# Patient Record
Sex: Male | Born: 1960 | Race: White | Hispanic: No | Marital: Married | Smoking: Former smoker
Health system: Southern US, Community
[De-identification: ages and names within clinical notes are randomized; demographics above are authoritative.]

## PROBLEM LIST (undated history)

## (undated) DIAGNOSIS — I219 Acute myocardial infarction, unspecified: Secondary | ICD-10-CM

## (undated) DIAGNOSIS — I509 Heart failure, unspecified: Secondary | ICD-10-CM

## (undated) DIAGNOSIS — K219 Gastro-esophageal reflux disease without esophagitis: Secondary | ICD-10-CM

## (undated) DIAGNOSIS — C801 Malignant (primary) neoplasm, unspecified: Secondary | ICD-10-CM

## (undated) DIAGNOSIS — I10 Essential (primary) hypertension: Secondary | ICD-10-CM

## (undated) DIAGNOSIS — E119 Type 2 diabetes mellitus without complications: Secondary | ICD-10-CM

## (undated) DIAGNOSIS — J45909 Unspecified asthma, uncomplicated: Secondary | ICD-10-CM

## (undated) HISTORY — PX: PACEMAKER IMPLANT: EP1218

## (undated) HISTORY — PX: TONSILLECTOMY: SUR1361

## (undated) HISTORY — PX: BACK SURGERY: SHX140

## (undated) HISTORY — PX: CARDIAC DEFIBRILLATOR PLACEMENT: SHX171

---

## 2000-10-18 ENCOUNTER — Emergency Department (HOSPITAL_COMMUNITY): Admission: EM | Admit: 2000-10-18 | Discharge: 2000-10-18 | Payer: Self-pay | Admitting: Emergency Medicine

## 2004-07-15 DIAGNOSIS — F32A Depression, unspecified: Secondary | ICD-10-CM

## 2004-07-15 HISTORY — DX: Depression, unspecified: F32.A

## 2014-05-20 HISTORY — PX: CORONARY ARTERY BYPASS GRAFT: SHX141

## 2019-02-24 HISTORY — PX: INSERT / REPLACE / REMOVE PACEMAKER: SUR710

## 2020-02-11 ENCOUNTER — Emergency Department (HOSPITAL_COMMUNITY): Payer: No Typology Code available for payment source

## 2020-02-11 ENCOUNTER — Other Ambulatory Visit: Payer: Self-pay

## 2020-02-11 ENCOUNTER — Encounter (HOSPITAL_COMMUNITY): Payer: Self-pay | Admitting: Emergency Medicine

## 2020-02-11 ENCOUNTER — Emergency Department (HOSPITAL_COMMUNITY)
Admission: EM | Admit: 2020-02-11 | Discharge: 2020-02-12 | Disposition: A | Discharge status: Home / Self Care | Payer: No Typology Code available for payment source | Attending: Emergency Medicine | Admitting: Emergency Medicine

## 2020-02-11 DIAGNOSIS — I110 Hypertensive heart disease with heart failure: Secondary | ICD-10-CM | POA: Insufficient documentation

## 2020-02-11 DIAGNOSIS — E119 Type 2 diabetes mellitus without complications: Secondary | ICD-10-CM | POA: Insufficient documentation

## 2020-02-11 DIAGNOSIS — Y998 Other external cause status: Secondary | ICD-10-CM | POA: Diagnosis not present

## 2020-02-11 DIAGNOSIS — Z950 Presence of cardiac pacemaker: Secondary | ICD-10-CM | POA: Diagnosis not present

## 2020-02-11 DIAGNOSIS — S335XXA Sprain of ligaments of lumbar spine, initial encounter: Secondary | ICD-10-CM | POA: Diagnosis not present

## 2020-02-11 DIAGNOSIS — S199XXA Unspecified injury of neck, initial encounter: Secondary | ICD-10-CM | POA: Diagnosis present

## 2020-02-11 DIAGNOSIS — S134XXA Sprain of ligaments of cervical spine, initial encounter: Secondary | ICD-10-CM | POA: Insufficient documentation

## 2020-02-11 DIAGNOSIS — Y92410 Unspecified street and highway as the place of occurrence of the external cause: Secondary | ICD-10-CM | POA: Insufficient documentation

## 2020-02-11 DIAGNOSIS — I509 Heart failure, unspecified: Secondary | ICD-10-CM | POA: Diagnosis not present

## 2020-02-11 DIAGNOSIS — Y9389 Activity, other specified: Secondary | ICD-10-CM | POA: Insufficient documentation

## 2020-02-11 DIAGNOSIS — Z85828 Personal history of other malignant neoplasm of skin: Secondary | ICD-10-CM | POA: Insufficient documentation

## 2020-02-11 DIAGNOSIS — S139XXA Sprain of joints and ligaments of unspecified parts of neck, initial encounter: Secondary | ICD-10-CM

## 2020-02-11 HISTORY — DX: Acute myocardial infarction, unspecified: I21.9

## 2020-02-11 HISTORY — DX: Malignant (primary) neoplasm, unspecified: C80.1

## 2020-02-11 HISTORY — DX: Essential (primary) hypertension: I10

## 2020-02-11 HISTORY — DX: Type 2 diabetes mellitus without complications: E11.9

## 2020-02-11 HISTORY — DX: Heart failure, unspecified: I50.9

## 2020-02-11 MED ORDER — IBUPROFEN 400 MG PO TABS
400.0000 mg | ORAL_TABLET | Freq: Once | ORAL | Status: DC
  Filled 2020-02-11: qty 1
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
## 2020-02-12 ENCOUNTER — Emergency Department (HOSPITAL_COMMUNITY): Payer: No Typology Code available for payment source

## 2020-02-12 DIAGNOSIS — S134XXA Sprain of ligaments of cervical spine, initial encounter: Secondary | ICD-10-CM | POA: Diagnosis not present

## 2020-02-12 MED ORDER — TRAMADOL HCL 50 MG PO TABS: 50.0000 mg | ORAL_TABLET | Freq: Four times a day (QID) | ORAL | 0 refills | Status: DC | PRN

## 2020-02-12 NOTE — Discharge Instructions (Signed)
Apply ice as needed

## 2020-02-12 NOTE — ED Notes (Signed)
Pt back from CT

## 2020-03-20 ENCOUNTER — Other Ambulatory Visit (HOSPITAL_COMMUNITY): Payer: Self-pay | Admitting: Orthopedic Surgery

## 2020-03-20 DIAGNOSIS — G629 Polyneuropathy, unspecified: Secondary | ICD-10-CM

## 2020-03-24 ENCOUNTER — Telehealth (HOSPITAL_COMMUNITY): Payer: Self-pay

## 2020-03-30 ENCOUNTER — Ambulatory Visit (HOSPITAL_COMMUNITY): Admission: RE | Admit: 2020-03-30 | Payer: No Typology Code available for payment source | Source: Ambulatory Visit

## 2020-03-30 ENCOUNTER — Encounter (HOSPITAL_COMMUNITY): Payer: Self-pay

## 2020-04-11 ENCOUNTER — Ambulatory Visit (HOSPITAL_COMMUNITY)
Admission: RE | Admit: 2020-04-11 | Discharge: 2020-04-11 | Disposition: A | Discharge status: Home / Self Care | Payer: BC Managed Care – PPO | Source: Ambulatory Visit | Attending: Physician Assistant | Admitting: Physician Assistant

## 2020-04-11 ENCOUNTER — Ambulatory Visit (HOSPITAL_COMMUNITY)
Admission: RE | Admit: 2020-04-11 | Discharge: 2020-04-11 | Disposition: A | Discharge status: Home / Self Care | Payer: BC Managed Care – PPO | Source: Ambulatory Visit | Attending: Orthopedic Surgery | Admitting: Orthopedic Surgery

## 2020-04-11 ENCOUNTER — Other Ambulatory Visit: Payer: Self-pay

## 2020-04-11 DIAGNOSIS — Z950 Presence of cardiac pacemaker: Secondary | ICD-10-CM

## 2020-04-11 DIAGNOSIS — G629 Polyneuropathy, unspecified: Secondary | ICD-10-CM | POA: Diagnosis not present

## 2020-04-11 NOTE — Progress Notes (Signed)
Per order, Programmed to  DOO at 90 bpm  Tachy-therapies to off/disabled  Will program device back to pre-MRI settings after completion of exam and send transmission.

## 2020-05-20 DIAGNOSIS — Z95810 Presence of automatic (implantable) cardiac defibrillator: Secondary | ICD-10-CM

## 2020-05-20 DIAGNOSIS — Z950 Presence of cardiac pacemaker: Secondary | ICD-10-CM

## 2020-05-20 HISTORY — DX: Presence of automatic (implantable) cardiac defibrillator: Z95.810

## 2020-05-20 HISTORY — PX: CARDIAC CATHETERIZATION: SHX172

## 2020-05-20 HISTORY — DX: Presence of cardiac pacemaker: Z95.0

## 2020-09-08 ENCOUNTER — Other Ambulatory Visit: Payer: Self-pay | Admitting: Neurosurgery

## 2020-09-27 DIAGNOSIS — M5416 Radiculopathy, lumbar region: Secondary | ICD-10-CM

## 2020-10-06 NOTE — Progress Notes (Signed)
Surgical Instructions    Your procedure is scheduled on 10/11/20.  Report to [facility] Main Entrance "A" at 5:30 A.M., then check in with the Admitting office.  Call this number if you have problems the morning of surgery:  [phone]   If you have any questions prior to your surgery date call [phone]: Open Monday-Friday 8am-4pm    Remember:  Do not eat or drink after midnight the night before your surgery    Take these medicines the morning of surgery with A SIP OF WATER: FLUoxetine (PROZAC)  gabapentin (NEURONTIN)  isosorbide mononitrate (IMDUR)  metoprolol (TOPROL-XL) pravastatin (PRAVACHOL)  If Needed: Albuterol Sulfate (PROAIR RESPICLICK)   As of today, STOP taking any  Aleve, Naproxen, Ibuprofen, Motrin, Advil, Goody's, BC's, all herbal medications, fish oil, and all vitamins.  Follow your surgeon's instructions on when to stop Aspirin and Plavix.  If no instructions were given by your surgeon then you will need to call the office to get those instructions.     WHAT DO I DO ABOUT MY DIABETES MEDICATION?   Marland Kitchen Do not take Glipizide night before surgery or day of surgery.   Do not take Jardiance day before surgery or day of surgery.   . THE NIGHT BEFORE SURGERY, take _____35______ units of ___Lantus________insulin.       . THE MORNING OF SURGERY, do not take any insuline.  . The day of surgery, do not take other diabetes injectables, including Byetta (exenatide), Bydureon (exenatide ER), Victoza (liraglutide), or Trulicity (dulaglutide).  . If your CBG is greater than 220 mg/dL, you may take  of your sliding scale (correction) dose of insulin.   HOW TO MANAGE YOUR DIABETES BEFORE AND AFTER SURGERY  Why is it important to control my blood sugar before and after surgery? . Improving blood sugar levels before and after surgery helps healing and can limit problems. . A way of improving blood sugar control is eating a healthy diet by: o  Eating less sugar and  carbohydrates o  Increasing activity/exercise o  Talking with your doctor about reaching your blood sugar goals . High blood sugars (greater than 180 mg/dL) can raise your risk of infections and slow your recovery, so you will need to focus on controlling your diabetes during the weeks before surgery. . Make sure that the doctor who takes care of your diabetes knows about your planned surgery including the date and location.  How do I manage my blood sugar before surgery? . Check your blood sugar at least 4 times a day, starting 2 days before surgery, to make sure that the level is not too high or low.  . Check your blood sugar the morning of your surgery when you wake up and every 2 hours until you get to the Short Stay unit.  o If your blood sugar is less than 70 mg/dL, you will need to treat for low blood sugar: - Do not take insulin. - Treat a low blood sugar (less than 70 mg/dL) with  cup of clear juice (cranberry or apple), 4 glucose tablets, OR glucose gel. - Recheck blood sugar in 15 minutes after treatment (to make sure it is greater than 70 mg/dL). If your blood sugar is not greater than 70 mg/dL on recheck, call [phone] for further instructions. . Report your blood sugar to the short stay nurse when you get to Short Stay.  . If you are admitted to the hospital after surgery: o Your blood sugar will be checked by  the staff and you will probably be given insulin after surgery (instead of oral diabetes medicines) to make sure you have good blood sugar levels. o The goal for blood sugar control after surgery is 80-180 mg/dL.                      Do not wear jewelry            Do not wear lotions, powders, colognes, or deodorant.            Do not shave 48 hours prior to surgery.  Men may shave face and neck.            Do not bring valuables to the hospital.            [facility] is not responsible for any belongings or valuables.  Do NOT Smoke (Tobacco/Vaping) or drink  Alcohol 24 hours prior to your procedure If you use a CPAP at night, you may bring all equipment for your overnight stay.   Contacts, glasses, dentures or bridgework may not be worn into surgery, please bring cases for these belongings   For patients admitted to the hospital, discharge time will be determined by your treatment team.   Patients discharged the day of surgery will not be allowed to drive home, and someone needs to stay with them for 24 hours.    Special instructions:    Oral Hygiene is also important to reduce your risk of infection.  Remember - BRUSH YOUR TEETH THE MORNING OF SURGERY WITH YOUR REGULAR TOOTHPASTE   [place]- Preparing For Surgery  Before surgery, you can play an important role. Because skin is not sterile, your skin needs to be as free of germs as possible. You can reduce the number of germs on your skin by washing with CHG (chlorahexidine gluconate) Soap before surgery.  CHG is an antiseptic cleaner which kills germs and bonds with the skin to continue killing germs even after washing.     Please do not use if you have an allergy to CHG or antibacterial soaps. If your skin becomes reddened/irritated stop using the CHG.  Do not shave (including legs and underarms) for at least 48 hours prior to first CHG shower. It is OK to shave your face.  Please follow these instructions carefully.    1.  Shower the NIGHT BEFORE SURGERY and the MORNING OF SURGERY with CHG Soap.   If you chose to wash your hair, wash your hair first as usual with your normal shampoo. After you shampoo, rinse your hair and body thoroughly to remove the shampoo.  Then [employer] and genitals (private parts) with your normal soap and rinse thoroughly to remove soap.  2. After that Use CHG Soap as you would any other liquid soap. You can apply CHG directly to the skin and wash gently with a scrungie or a clean washcloth.   3. Apply the CHG Soap to your body ONLY FROM THE NECK DOWN.  Do  not use on open wounds or open sores. Avoid contact with your eyes, ears, mouth and genitals (private parts). Wash Face and genitals (private parts)  with your normal soap.   4. Wash thoroughly, paying special attention to the area where your surgery will be performed.  5. Thoroughly rinse your body with warm water from the neck down.  6. DO NOT shower/wash with your normal soap after using and rinsing off the CHG Soap.  7. [place] yourself  dry with a CLEAN TOWEL.  8. Wear CLEAN PAJAMAS to bed the night before surgery  9. Place CLEAN SHEETS on your bed the night before your surgery  10. DO NOT SLEEP WITH PETS.   Day of Surgery:  Take a shower with CHG soap. Wear Clean/Comfortable clothing the morning of surgery Do not apply any deodorants/lotions.   Remember to brush your teeth WITH YOUR REGULAR TOOTHPASTE.   Please read over the following fact sheets that you were given.

## 2020-10-09 ENCOUNTER — Encounter (HOSPITAL_COMMUNITY): Payer: Self-pay

## 2020-10-09 ENCOUNTER — Encounter (HOSPITAL_COMMUNITY)
Admission: RE | Admit: 2020-10-09 | Discharge: 2020-10-09 | Disposition: A | Discharge status: Home / Self Care | Payer: BC Managed Care – PPO | Source: Ambulatory Visit | Attending: Neurosurgery | Admitting: Neurosurgery

## 2020-10-09 ENCOUNTER — Other Ambulatory Visit: Payer: Self-pay

## 2020-10-09 DIAGNOSIS — Z01812 Encounter for preprocedural laboratory examination: Secondary | ICD-10-CM | POA: Insufficient documentation

## 2020-10-09 DIAGNOSIS — Z20822 Contact with and (suspected) exposure to covid-19: Secondary | ICD-10-CM | POA: Insufficient documentation

## 2020-10-09 HISTORY — DX: Unspecified asthma, uncomplicated: J45.909

## 2020-10-09 HISTORY — DX: Gastro-esophageal reflux disease without esophagitis: K21.9

## 2020-10-09 LAB — BASIC METABOLIC PANEL
Anion gap: 9 (ref 5–15)
BUN: 23 mg/dL — ABNORMAL HIGH (ref 6–20)
CO2: 27 mmol/L (ref 22–32)
Calcium: 10 mg/dL (ref 8.9–10.3)
Chloride: 100 mmol/L (ref 98–111)
Creatinine, Ser: 1.1 mg/dL (ref 0.61–1.24)
GFR, Estimated: >60 mL/min (ref >60)
Glucose, Bld: 202 mg/dL — ABNORMAL HIGH (ref 70–99)
Potassium: 5.8 mmol/L — ABNORMAL HIGH (ref 3.5–5.1)
Sodium: 136 mmol/L (ref 135–145)

## 2020-10-09 LAB — SURGICAL PCR SCREEN
MRSA, PCR: NEGATIVE
Staphylococcus aureus: NEGATIVE

## 2020-10-09 LAB — TYPE AND SCREEN
ABO/RH(D): AB POS
Antibody Screen: NEGATIVE

## 2020-10-09 LAB — CBC
HCT: 50.9 % (ref 39.0–52.0)
Hemoglobin: 17.1 g/dL — ABNORMAL HIGH (ref 13.0–17.0)
MCH: 30.5 pg (ref 26.0–34.0)
MCHC: 33.6 g/dL (ref 30.0–36.0)
MCV: 90.7 fL (ref 80.0–100.0)
Platelets: 226 10*3/uL (ref 150–400)
RBC: 5.61 MIL/uL (ref 4.22–5.81)
RDW: 13.1 % (ref 11.5–15.5)
WBC: 7.9 10*3/uL (ref 4.0–10.5)
nRBC: 0 % (ref 0.0–0.2)

## 2020-10-09 LAB — GLUCOSE, CAPILLARY: Glucose-Capillary: 141 mg/dL — ABNORMAL HIGH (ref 70–99)

## 2020-10-09 LAB — SARS CORONAVIRUS 2 (TAT 6-24 HRS): SARS Coronavirus 2: NEGATIVE

## 2020-10-09 NOTE — Progress Notes (Signed)
PCP - Jillyn Hidden, PA Cardiologist - Dr. Vella Redhead in [facility] with [place] implantable defibrillator  EP Dr. Coral Ceo Device Orders -  Rep Notified -   Chest x-ray - 05/25/20 EKG - 02/14/20 Stress Test - In [facility] 07/10/2020 ECHO - 10/29/18 Cardiac Cath - 04/15/17 and again at [facility] July 24, 2020  Sleep Study - Denies  DM Type  Fasting Blood Sugar - 110's CBG at PAT appt 141 Checks Blood Sugar _3-4____ times a day  Blood Thinner Instructions: Stop Plavix 5 days prior to surgery Aspirin Instructions: Stop 5 days prior to surgery  COVID TEST-   Anesthesia review: Yes cardiac history  Patient denies shortness of breath, fever, cough and chest pain at PAT appointment   All instructions explained to the patient, with a verbal understanding of the material. Patient agrees to go over the instructions while at home for a better understanding.The opportunity to ask questions was provided.

## 2020-10-10 NOTE — Progress Notes (Signed)
Anesthesia Chart Review:   Case: [id number] Date/Time: 10/11/20 0715   Procedures:      TLIF - L3-L4 - L4-L5;Possible Right L5-S1 microdiscectomy (N/A )     LUMBAR LAMINECTOMY/DECOMPRESSION MICRODISCECTOMY 1 LEVEL (Right )   Anesthesia type: General   Pre-op diagnosis: Stenosis   Location: [facility] OR ROOM 20 / [place] [facility]   Surgeons: Vallarie Mare, MD      DISCUSSION: Pt is 60 years old with hx CAD (s/p CABG 2016; s/p overlapping DES to CX 2019; stent to distal LM ostial CX and proximal CX 2020), CHF, ischemic cardiomyopathy (EF 55-60% on 2021 echo (was 35% in 2020)), AICD ([employer] Jude BiV ICD implanted 02/24/2019), HTN, DM, asthma  - Glucose 202 at pre-admission testing; HbA1c 9.0 on 09/28/20 (care everywhere). I notified Nikki in Dr. Marcello Moores' office.   - K 5.8. I notified Nikki in Dr. Marcello Moores' office. She will have pt reach out to cardiologist for guidance. Will recheck K day of surgery.   Pt to stop plavix 5 days before surgery  Perioperative prescription for ICD form is pending from Dr. Rhina Brackett   VS: BP 133/82   Pulse 82   Temp 37 C (Oral)   Resp 18   Ht 5\' 11"  (1.803 m)   Wt 120.8 kg   SpO2 98%   BMI 37.15 kg/m    PROVIDERS: - PCP is Jillyn Hidden, PA-C - Cardiologist is Vella Redhead ([facility] in [place]). Pt cleared for surgery at acceptable risk at last office visit 08/29/20 (see notes on paper chart) - EP cardiologist is Coral Ceo, MD   LABS:    - HbA1c 9.0 on 09/28/20 (care everywhere); glucose 202 at pre-admission testing - K 5.8  (all labs ordered are listed, but only abnormal results are displayed)  Labs Reviewed  GLUCOSE, CAPILLARY - Abnormal; Notable for the following components:      Result Value   Glucose-Capillary 141 (*)    All other components within normal limits  CBC - Abnormal; Notable for the following components:   Hemoglobin 17.1 (*)    All other components within normal limits  BASIC METABOLIC PANEL - Abnormal; Notable for the  following components:   Potassium 5.8 (*)    Glucose, Bld 202 (*)    BUN 23 (*)    All other components within normal limits  SARS CORONAVIRUS 2 (TAT 6-24 HRS)  SURGICAL PCR SCREEN  TYPE AND SCREEN     IMAGES: CXR 05/25/20 (care everywhere):  - Normal heart size. CABG. Pacemaker. The lungs and pleural spaces are clear. Thoracic spondylosis. - No acute abnormality   EKG 02/11/20: Atrial-sensed ventricular-paced rhythm. Biventricular pacemaker detected   CV: Cardiac cath 07/24/20 ([place]) (done for high risk stress test, chest pain sx) - widely patent LM and LCX stents - widely patent LIMA graft and OM graft - RCA is small, nondominant and occluded as on previous cardiac catheterizations - No change when compared to prior cardiac cath 2020  Echo 09/05/19 (the following is from office visit note 08/29/20 by Dr. Vella Redhead- see paper chart): - LV systolic function normal. EF 55-60%. Study inadequate for evaluation of regional wall motion. Doppler parameters were consistent with abnormal LV relaxation   Past Medical History:  Diagnosis Date  . AICD (automatic cardioverter/defibrillator) present 2022  . Asthma   . Cancer ([place])    "skin cancer"  . CHF (congestive heart failure) ([place])   . Depression 07/15/2004  . Diabetes mellitus without complication (  HCC)   . GERD (gastroesophageal reflux disease)   . Hypertension   . MI (myocardial infarction) ([place])   . Presence of permanent cardiac pacemaker 2022    Past Surgical History:  Procedure Laterality Date  . [place], [place]  2022  . CARDIAC DEFIBRILLATOR PLACEMENT    . CARDIAC DEFIBRILLATOR PLACEMENT    . CORONARY ARTERY BYPASS GRAFT  2016  . INSERT / REPLACE / REMOVE PACEMAKER  02/24/2019  . PACEMAKER IMPLANT    . TONSILLECTOMY      MEDICATIONS: . Albuterol Sulfate (PROAIR RESPICLICK) 142 (90 Base) MCG/ACT AEPB  . BAYER LOW DOSE 81 MG chewable tablet  .  Cholecalciferol 50 MCG (2000 UT) CAPS  . clopidogrel (PLAVIX) 75 MG tablet  . ENTRESTO 97-103 MG  . FLUoxetine (PROZAC) 40 MG capsule  . furosemide (LASIX) 40 MG tablet  . gabapentin (NEURONTIN) 300 MG capsule  . glipiZIDE (GLUCOTROL) 10 MG tablet  . isosorbide mononitrate (IMDUR) 60 MG 24 hr tablet  . JARDIANCE 25 MG TABS tablet  . LANTUS SOLOSTAR 100 UNIT/ML Solostar Pen  . metFORMIN (GLUCOPHAGE) 1000 MG tablet  . metoprolol (TOPROL-XL) 200 MG 24 hr tablet  . nitroGLYCERIN (NITROSTAT) 0.4 MG SL tablet  . NOVOLOG FLEXPEN 100 UNIT/ML FlexPen  . OVER THE COUNTER MEDICATION  . Potassium 99 MG TABS  . pravastatin (PRAVACHOL) 80 MG tablet  . spironolactone (ALDACTONE) 25 MG tablet  . traMADol (ULTRAM) 50 MG tablet   No current facility-administered medications for this encounter.   - Pt to stop plavix 5 days before surgery   If glucose and K are acceptable day of surgery, I anticipate pt can proceed with surgery as scheduled.  Willeen Cass, PhD, FNP-BC [facility]/Anesthesiology Phone: [phone] 10/10/2020 12:38 PM

## 2020-10-10 NOTE — Anesthesia Preprocedure Evaluation (Addendum)
Anesthesia Evaluation  Patient identified by MRN, date of birth, ID band Patient awake    Reviewed: Allergy & Precautions, NPO status , Patient's Chart, lab work & pertinent test results, reviewed documented beta blocker date and time   History of Anesthesia Complications Negative for: history of anesthetic complications  Airway Mallampati: III  TM Distance: >3 FB Neck ROM: Full    Dental  (+) Dental Advisory Given, Partial Upper   Pulmonary asthma , former smoker,    Pulmonary exam normal        Cardiovascular hypertension, Pt. on home beta blockers and Pt. on medications + Past MI, + Cardiac Stents, + CABG and +CHF  Normal cardiovascular exam+ pacemaker + Cardiac Defibrillator    Cardiac cath 07/24/20 ([place]) (done for high risk stress test, chest pain sx) - widely patent LM and LCX stents - widely patent LIMA graft and OM graft - RCA is small, nondominant and occluded as on previous cardiac catheterizations - No change when compared to prior cardiac cath 2020  Echo 09/05/19 (the following is from office visit note 08/29/20 by Dr. Vella Redhead- see paper chart): - LV systolic function normal. EF 55-60%. Study inadequate for evaluation of regional wall motion. Doppler parameters were consistent with abnormal LV relaxation  EKG 02/11/20: Atrial-sensed ventricular-paced rhythm. Biventricular pacemaker detected    Neuro/Psych PSYCHIATRIC DISORDERS Depression negative neurological ROS     GI/Hepatic Neg liver ROS, GERD  Controlled and Medicated,  Endo/Other  diabetes, Type 2, Oral Hypoglycemic Agents, Insulin Dependent Obesity   Renal/GU negative Renal ROS     Musculoskeletal negative musculoskeletal ROS (+)   Abdominal   Peds  Hematology  On plavix    Anesthesia Other Findings Covid test negative   Reproductive/Obstetrics                          Anesthesia  Physical Anesthesia Plan  ASA: III  Anesthesia Plan: General   Post-op Pain Management:    Induction: Intravenous  PONV Risk Score and Plan: 3 and Treatment may vary due to age or medical condition, Ondansetron, Dexamethasone, Midazolam and Scopolamine patch - Pre-op  Airway Management Planned: Oral ETT  Additional Equipment: None  Intra-op Plan:   Post-operative Plan: Extubation in OR  Informed Consent: I have reviewed the patients History and Physical, chart, labs and discussed the procedure including the risks, benefits and alternatives for the proposed anesthesia with the patient or authorized representative who has indicated his/her understanding and acceptance.     Dental advisory given  Plan Discussed with: CRNA and Anesthesiologist  Anesthesia Plan Comments:      Anesthesia Quick Evaluation

## 2020-10-11 ENCOUNTER — Inpatient Hospital Stay (HOSPITAL_COMMUNITY)
Admission: RE | Admit: 2020-10-11 | Discharge: 2020-10-14 | DRG: 454 | Disposition: A | Discharge status: Home Health | Payer: BC Managed Care – PPO | Attending: Neurosurgery | Admitting: Neurosurgery

## 2020-10-11 ENCOUNTER — Inpatient Hospital Stay (HOSPITAL_COMMUNITY): Payer: BC Managed Care – PPO

## 2020-10-11 ENCOUNTER — Encounter (HOSPITAL_COMMUNITY)
Admission: RE | Disposition: A | Discharge status: Home Health | Payer: Self-pay | Source: Home / Self Care | Attending: Neurosurgery

## 2020-10-11 ENCOUNTER — Inpatient Hospital Stay (HOSPITAL_COMMUNITY): Payer: BC Managed Care – PPO | Admitting: Physician Assistant

## 2020-10-11 ENCOUNTER — Inpatient Hospital Stay (HOSPITAL_COMMUNITY): Payer: BC Managed Care – PPO | Admitting: Vascular Surgery

## 2020-10-11 ENCOUNTER — Encounter (HOSPITAL_COMMUNITY): Payer: Self-pay

## 2020-10-11 ENCOUNTER — Other Ambulatory Visit: Payer: Self-pay

## 2020-10-11 DIAGNOSIS — I2510 Atherosclerotic heart disease of native coronary artery without angina pectoris: Secondary | ICD-10-CM | POA: Diagnosis present

## 2020-10-11 DIAGNOSIS — G9741 Accidental puncture or laceration of dura during a procedure: Secondary | ICD-10-CM | POA: Diagnosis not present

## 2020-10-11 DIAGNOSIS — M5416 Radiculopathy, lumbar region: Secondary | ICD-10-CM | POA: Diagnosis present

## 2020-10-11 DIAGNOSIS — J45909 Unspecified asthma, uncomplicated: Secondary | ICD-10-CM | POA: Diagnosis present

## 2020-10-11 DIAGNOSIS — Z6838 Body mass index (BMI) 38.0-38.9, adult: Secondary | ICD-10-CM | POA: Diagnosis not present

## 2020-10-11 DIAGNOSIS — Z87891 Personal history of nicotine dependence: Secondary | ICD-10-CM | POA: Diagnosis not present

## 2020-10-11 DIAGNOSIS — Z951 Presence of aortocoronary bypass graft: Secondary | ICD-10-CM

## 2020-10-11 DIAGNOSIS — E669 Obesity, unspecified: Secondary | ICD-10-CM | POA: Diagnosis present

## 2020-10-11 DIAGNOSIS — Z888 Allergy status to other drugs, medicaments and biological substances status: Secondary | ICD-10-CM | POA: Diagnosis not present

## 2020-10-11 DIAGNOSIS — E119 Type 2 diabetes mellitus without complications: Secondary | ICD-10-CM | POA: Diagnosis present

## 2020-10-11 DIAGNOSIS — Y838 Other surgical procedures as the cause of abnormal reaction of the patient, or of later complication, without mention of misadventure at the time of the procedure: Secondary | ICD-10-CM | POA: Diagnosis not present

## 2020-10-11 DIAGNOSIS — Z794 Long term (current) use of insulin: Secondary | ICD-10-CM | POA: Diagnosis not present

## 2020-10-11 DIAGNOSIS — Z85828 Personal history of other malignant neoplasm of skin: Secondary | ICD-10-CM | POA: Diagnosis not present

## 2020-10-11 DIAGNOSIS — Z7982 Long term (current) use of aspirin: Secondary | ICD-10-CM

## 2020-10-11 DIAGNOSIS — I252 Old myocardial infarction: Secondary | ICD-10-CM | POA: Diagnosis not present

## 2020-10-11 DIAGNOSIS — F32A Depression, unspecified: Secondary | ICD-10-CM | POA: Diagnosis present

## 2020-10-11 DIAGNOSIS — I10 Essential (primary) hypertension: Secondary | ICD-10-CM | POA: Diagnosis present

## 2020-10-11 DIAGNOSIS — E11649 Type 2 diabetes mellitus with hypoglycemia without coma: Secondary | ICD-10-CM | POA: Diagnosis not present

## 2020-10-11 DIAGNOSIS — K219 Gastro-esophageal reflux disease without esophagitis: Secondary | ICD-10-CM | POA: Diagnosis present

## 2020-10-11 DIAGNOSIS — Z79899 Other long term (current) drug therapy: Secondary | ICD-10-CM | POA: Diagnosis not present

## 2020-10-11 DIAGNOSIS — Z95810 Presence of automatic (implantable) cardiac defibrillator: Secondary | ICD-10-CM | POA: Diagnosis not present

## 2020-10-11 DIAGNOSIS — I255 Ischemic cardiomyopathy: Secondary | ICD-10-CM | POA: Diagnosis present

## 2020-10-11 DIAGNOSIS — M48061 Spinal stenosis, lumbar region without neurogenic claudication: Secondary | ICD-10-CM | POA: Diagnosis present

## 2020-10-11 DIAGNOSIS — Z20822 Contact with and (suspected) exposure to covid-19: Secondary | ICD-10-CM | POA: Diagnosis present

## 2020-10-11 DIAGNOSIS — Z419 Encounter for procedure for purposes other than remedying health state, unspecified: Secondary | ICD-10-CM

## 2020-10-11 HISTORY — PX: TRANSFORAMINAL LUMBAR INTERBODY FUSION (TLIF) WITH PEDICLE SCREW FIXATION 2 LEVEL: SHX6142

## 2020-10-11 LAB — POCT I-STAT, CHEM 8
BUN: 22 mg/dL — ABNORMAL HIGH (ref 6–20)
Calcium, Ion: 1.15 mmol/L (ref 1.15–1.40)
Chloride: 103 mmol/L (ref 98–111)
Creatinine, Ser: 1 mg/dL (ref 0.61–1.24)
Glucose, Bld: 102 mg/dL — ABNORMAL HIGH (ref 70–99)
HCT: 46 % (ref 39.0–52.0)
Hemoglobin: 15.6 g/dL (ref 13.0–17.0)
Potassium: 4.2 mmol/L (ref 3.5–5.1)
Sodium: 138 mmol/L (ref 135–145)
TCO2: 27 mmol/L (ref 22–32)

## 2020-10-11 LAB — GLUCOSE, CAPILLARY
Glucose-Capillary: 113 mg/dL — ABNORMAL HIGH (ref 70–99)
Glucose-Capillary: 244 mg/dL — ABNORMAL HIGH (ref 70–99)

## 2020-10-11 LAB — ABO/RH: ABO/RH(D): AB POS

## 2020-10-11 SURGERY — TRANSFORAMINAL LUMBAR INTERBODY FUSION (TLIF) WITH PEDICLE SCREW FIXATION 2 LEVEL
Anesthesia: General | Site: Back | Laterality: Right

## 2020-10-11 MED ORDER — BUPIVACAINE HCL (PF) 0.5 % IJ SOLN
INTRAMUSCULAR | Status: DC | PRN
  Administered 2020-10-11: 30 mL

## 2020-10-11 MED ORDER — SPIRONOLACTONE 25 MG PO TABS
25.0000 mg | ORAL_TABLET | Freq: Every day | ORAL | Status: DC
  Administered 2020-10-12 – 2020-10-14 (×3): 25 mg via ORAL
  Filled 2020-10-11 (×3): qty 1

## 2020-10-11 MED ORDER — ONDANSETRON HCL 4 MG/2ML IJ SOLN
INTRAMUSCULAR | Status: DC | PRN
  Administered 2020-10-11: 4 mg via INTRAVENOUS

## 2020-10-11 MED ORDER — OXYCODONE HCL 5 MG PO TABS
ORAL_TABLET | ORAL | Status: AC
  Filled 2020-10-11: qty 1

## 2020-10-11 MED ORDER — EPHEDRINE 5 MG/ML INJ
INTRAVENOUS | Status: AC
  Filled 2020-10-11: qty 10

## 2020-10-11 MED ORDER — ALBUMIN HUMAN 5 % IV SOLN: INTRAVENOUS | Status: DC | PRN

## 2020-10-11 MED ORDER — INSULIN GLARGINE 100 UNIT/ML ~~LOC~~ SOLN
72.0000 [IU] | Freq: Every day | SUBCUTANEOUS | Status: DC
  Administered 2020-10-11 – 2020-10-13 (×3): 72 [IU] via SUBCUTANEOUS
  Filled 2020-10-11 (×5): qty 0.72

## 2020-10-11 MED ORDER — SUGAMMADEX SODIUM 200 MG/2ML IV SOLN
INTRAVENOUS | Status: DC | PRN
  Administered 2020-10-11: 100 mg via INTRAVENOUS
  Administered 2020-10-11: 50 mg via INTRAVENOUS
  Administered 2020-10-11: 100 mg via INTRAVENOUS

## 2020-10-11 MED ORDER — OXYCODONE HCL 5 MG PO TABS
5.0000 mg | ORAL_TABLET | Freq: Once | ORAL | Status: AC | PRN
  Administered 2020-10-11: 5 mg via ORAL

## 2020-10-11 MED ORDER — INSULIN ASPART 100 UNIT/ML IJ SOLN
INTRAMUSCULAR | Status: AC
  Filled 2020-10-11: qty 1

## 2020-10-11 MED ORDER — ONDANSETRON HCL 4 MG PO TABS: 4.0000 mg | ORAL_TABLET | Freq: Four times a day (QID) | ORAL | Status: DC | PRN

## 2020-10-11 MED ORDER — CYCLOBENZAPRINE HCL 10 MG PO TABS
10.0000 mg | ORAL_TABLET | Freq: Three times a day (TID) | ORAL | Status: DC | PRN
  Administered 2020-10-12 – 2020-10-14 (×3): 10 mg via ORAL
  Filled 2020-10-11 (×3): qty 1

## 2020-10-11 MED ORDER — CEFAZOLIN SODIUM 1 G IJ SOLR
INTRAMUSCULAR | Status: AC
  Filled 2020-10-11: qty 10

## 2020-10-11 MED ORDER — LACTATED RINGERS IV SOLN: INTRAVENOUS | Status: DC | PRN

## 2020-10-11 MED ORDER — METFORMIN HCL 500 MG PO TABS
1000.0000 mg | ORAL_TABLET | Freq: Two times a day (BID) | ORAL | Status: DC
  Administered 2020-10-12 – 2020-10-14 (×5): 1000 mg via ORAL
  Filled 2020-10-11 (×5): qty 2

## 2020-10-11 MED ORDER — PROMETHAZINE HCL 25 MG/ML IJ SOLN: 6.2500 mg | INTRAMUSCULAR | Status: DC | PRN

## 2020-10-11 MED ORDER — INSULIN ASPART 100 UNIT/ML IJ SOLN
30.0000 [IU] | Freq: Three times a day (TID) | INTRAMUSCULAR | Status: DC
  Administered 2020-10-12 – 2020-10-14 (×7): 30 [IU] via SUBCUTANEOUS

## 2020-10-11 MED ORDER — LACTATED RINGERS IV SOLN: INTRAVENOUS | Status: DC

## 2020-10-11 MED ORDER — OXYCODONE-ACETAMINOPHEN 5-325 MG PO TABS
1.0000 | ORAL_TABLET | Freq: Four times a day (QID) | ORAL | Status: DC | PRN
  Administered 2020-10-11 – 2020-10-14 (×8): 2 via ORAL
  Filled 2020-10-11 (×8): qty 2

## 2020-10-11 MED ORDER — CHLORHEXIDINE GLUCONATE CLOTH 2 % EX PADS: 6.0000 | MEDICATED_PAD | Freq: Once | CUTANEOUS | Status: DC

## 2020-10-11 MED ORDER — ONDANSETRON HCL 4 MG/2ML IJ SOLN
INTRAMUSCULAR | Status: AC
  Filled 2020-10-11: qty 2

## 2020-10-11 MED ORDER — FENTANYL CITRATE (PF) 100 MCG/2ML IJ SOLN
INTRAMUSCULAR | Status: AC
  Filled 2020-10-11: qty 2

## 2020-10-11 MED ORDER — FLUOXETINE HCL 20 MG PO CAPS
40.0000 mg | ORAL_CAPSULE | Freq: Every day | ORAL | Status: DC
  Administered 2020-10-12 – 2020-10-14 (×3): 40 mg via ORAL
  Filled 2020-10-11 (×3): qty 2

## 2020-10-11 MED ORDER — 0.9 % SODIUM CHLORIDE (POUR BTL) OPTIME
TOPICAL | Status: DC | PRN
  Administered 2020-10-11: 1000 mL

## 2020-10-11 MED ORDER — DEXAMETHASONE SODIUM PHOSPHATE 10 MG/ML IJ SOLN
INTRAMUSCULAR | Status: DC | PRN
  Administered 2020-10-11: 10 mg via INTRAVENOUS

## 2020-10-11 MED ORDER — ENOXAPARIN SODIUM 40 MG/0.4ML IJ SOSY
40.0000 mg | PREFILLED_SYRINGE | INTRAMUSCULAR | Status: DC
  Administered 2020-10-12 – 2020-10-14 (×3): 40 mg via SUBCUTANEOUS
  Filled 2020-10-11 (×3): qty 0.4

## 2020-10-11 MED ORDER — PHENYLEPHRINE HCL-NACL 10-0.9 MG/250ML-% IV SOLN
INTRAVENOUS | Status: DC | PRN
  Administered 2020-10-11: 50 ug/min via INTRAVENOUS
  Administered 2020-10-11: 75 ug/min via INTRAVENOUS

## 2020-10-11 MED ORDER — SODIUM CHLORIDE 0.9 % IV SOLN
250.0000 mL | INTRAVENOUS | Status: DC
  Administered 2020-10-11: 250 mL via INTRAVENOUS

## 2020-10-11 MED ORDER — LIDOCAINE-EPINEPHRINE 1 %-1:100000 IJ SOLN
INTRAMUSCULAR | Status: DC | PRN
  Administered 2020-10-11: 10 mL

## 2020-10-11 MED ORDER — POTASSIUM CHLORIDE IN NACL 20-0.9 MEQ/L-% IV SOLN
INTRAVENOUS | Status: DC
  Filled 2020-10-11: qty 1000

## 2020-10-11 MED ORDER — SODIUM CHLORIDE 0.9% FLUSH: 3.0000 mL | INTRAVENOUS | Status: DC | PRN

## 2020-10-11 MED ORDER — EPHEDRINE SULFATE-NACL 50-0.9 MG/10ML-% IV SOSY
PREFILLED_SYRINGE | INTRAVENOUS | Status: DC | PRN
  Administered 2020-10-11: 10 mg via INTRAVENOUS
  Administered 2020-10-11 (×3): 5 mg via INTRAVENOUS
  Administered 2020-10-11: 10 mg via INTRAVENOUS
  Administered 2020-10-11: 5 mg via INTRAVENOUS

## 2020-10-11 MED ORDER — MIDAZOLAM HCL 2 MG/2ML IJ SOLN
INTRAMUSCULAR | Status: AC
  Filled 2020-10-11: qty 2

## 2020-10-11 MED ORDER — THROMBIN 5000 UNITS EX SOLR
CUTANEOUS | Status: AC
  Filled 2020-10-11: qty 5000

## 2020-10-11 MED ORDER — ACETAMINOPHEN 325 MG PO TABS: 650.0000 mg | ORAL_TABLET | ORAL | Status: DC | PRN

## 2020-10-11 MED ORDER — FUROSEMIDE 40 MG PO TABS
40.0000 mg | ORAL_TABLET | Freq: Two times a day (BID) | ORAL | Status: DC
  Administered 2020-10-12 – 2020-10-14 (×5): 40 mg via ORAL
  Filled 2020-10-11 (×5): qty 1

## 2020-10-11 MED ORDER — BUPIVACAINE HCL (PF) 0.5 % IJ SOLN
INTRAMUSCULAR | Status: AC
  Filled 2020-10-11: qty 30

## 2020-10-11 MED ORDER — PRAVASTATIN SODIUM 40 MG PO TABS
80.0000 mg | ORAL_TABLET | Freq: Every day | ORAL | Status: DC
  Administered 2020-10-12 – 2020-10-13 (×2): 80 mg via ORAL
  Filled 2020-10-11 (×3): qty 2

## 2020-10-11 MED ORDER — INSULIN GLARGINE 100 UNIT/ML SOLOSTAR PEN: 72.0000 [IU] | PEN_INJECTOR | Freq: Every day | SUBCUTANEOUS | Status: DC

## 2020-10-11 MED ORDER — INSULIN ASPART 100 UNIT/ML IJ SOLN
5.0000 [IU] | Freq: Once | INTRAMUSCULAR | Status: AC
  Administered 2020-10-11: 5 [IU] via SUBCUTANEOUS

## 2020-10-11 MED ORDER — PROPOFOL 10 MG/ML IV BOLUS
INTRAVENOUS | Status: DC | PRN
  Administered 2020-10-11: 160 mg via INTRAVENOUS

## 2020-10-11 MED ORDER — CEFAZOLIN SODIUM-DEXTROSE 1-4 GM/50ML-% IV SOLN
INTRAVENOUS | Status: DC | PRN
  Administered 2020-10-11: 1 g via INTRAVENOUS

## 2020-10-11 MED ORDER — INSULIN ASPART 100 UNIT/ML FLEXPEN: 30.0000 [IU] | PEN_INJECTOR | Freq: Three times a day (TID) | SUBCUTANEOUS | Status: DC

## 2020-10-11 MED ORDER — CEFAZOLIN SODIUM-DEXTROSE 2-4 GM/100ML-% IV SOLN
2.0000 g | Freq: Three times a day (TID) | INTRAVENOUS | Status: AC
  Administered 2020-10-11 – 2020-10-12 (×4): 2 g via INTRAVENOUS
  Filled 2020-10-11 (×5): qty 100

## 2020-10-11 MED ORDER — ACETAMINOPHEN 650 MG RE SUPP: 650.0000 mg | RECTAL | Status: DC | PRN

## 2020-10-11 MED ORDER — ALBUTEROL SULFATE 108 (90 BASE) MCG/ACT IN AEPB: 2.0000 | INHALATION_SPRAY | Freq: Three times a day (TID) | RESPIRATORY_TRACT | Status: DC | PRN

## 2020-10-11 MED ORDER — SODIUM CHLORIDE (PF) 0.9 % IJ SOLN
INTRAMUSCULAR | Status: DC | PRN
  Administered 2020-10-11: 10 mL

## 2020-10-11 MED ORDER — CEFAZOLIN SODIUM-DEXTROSE 2-4 GM/100ML-% IV SOLN
2.0000 g | INTRAVENOUS | Status: AC
  Administered 2020-10-11 (×2): 2 g via INTRAVENOUS
  Filled 2020-10-11: qty 100

## 2020-10-11 MED ORDER — PHENOL 1.4 % MT LIQD: 1.0000 | OROMUCOSAL | Status: DC | PRN

## 2020-10-11 MED ORDER — SACUBITRIL-VALSARTAN 97-103 MG PO TABS
1.0000 | ORAL_TABLET | Freq: Two times a day (BID) | ORAL | Status: DC
  Administered 2020-10-11 – 2020-10-14 (×6): 1 via ORAL
  Filled 2020-10-11 (×7): qty 1

## 2020-10-11 MED ORDER — FENTANYL CITRATE (PF) 100 MCG/2ML IJ SOLN
25.0000 ug | INTRAMUSCULAR | Status: DC | PRN
  Administered 2020-10-11 (×3): 50 ug via INTRAVENOUS

## 2020-10-11 MED ORDER — HEMOSTATIC AGENTS (NO CHARGE) OPTIME
TOPICAL | Status: DC | PRN
  Administered 2020-10-11: 1 via TOPICAL

## 2020-10-11 MED ORDER — POLYETHYLENE GLYCOL 3350 17 G PO PACK: 17.0000 g | PACK | Freq: Every day | ORAL | Status: DC | PRN

## 2020-10-11 MED ORDER — PHENYLEPHRINE 40 MCG/ML (10ML) SYRINGE FOR IV PUSH (FOR BLOOD PRESSURE SUPPORT)
PREFILLED_SYRINGE | INTRAVENOUS | Status: AC
  Filled 2020-10-11: qty 10

## 2020-10-11 MED ORDER — EMPAGLIFLOZIN 25 MG PO TABS
25.0000 mg | ORAL_TABLET | Freq: Every day | ORAL | Status: DC
  Administered 2020-10-12 – 2020-10-14 (×3): 25 mg via ORAL
  Filled 2020-10-11 (×3): qty 1

## 2020-10-11 MED ORDER — METOPROLOL SUCCINATE ER 50 MG PO TB24
200.0000 mg | ORAL_TABLET | Freq: Every day | ORAL | Status: DC
  Administered 2020-10-12 – 2020-10-14 (×3): 200 mg via ORAL
  Filled 2020-10-11 (×3): qty 4

## 2020-10-11 MED ORDER — GABAPENTIN 300 MG PO CAPS
600.0000 mg | ORAL_CAPSULE | Freq: Three times a day (TID) | ORAL | Status: DC
  Administered 2020-10-11 – 2020-10-14 (×8): 600 mg via ORAL
  Filled 2020-10-11 (×8): qty 2

## 2020-10-11 MED ORDER — DOCUSATE SODIUM 100 MG PO CAPS
100.0000 mg | ORAL_CAPSULE | Freq: Two times a day (BID) | ORAL | Status: DC
  Administered 2020-10-11 – 2020-10-14 (×5): 100 mg via ORAL
  Filled 2020-10-11 (×6): qty 1

## 2020-10-11 MED ORDER — BUPIVACAINE LIPOSOME 1.3 % IJ SUSP
INTRAMUSCULAR | Status: DC | PRN
  Administered 2020-10-11: 20 mL

## 2020-10-11 MED ORDER — MIDAZOLAM HCL 5 MG/5ML IJ SOLN
INTRAMUSCULAR | Status: DC | PRN
  Administered 2020-10-11: 2 mg via INTRAVENOUS

## 2020-10-11 MED ORDER — KETAMINE HCL 50 MG/5ML IJ SOSY
PREFILLED_SYRINGE | INTRAMUSCULAR | Status: AC
  Filled 2020-10-11: qty 5

## 2020-10-11 MED ORDER — CHLORHEXIDINE GLUCONATE 0.12 % MT SOLN
15.0000 mL | Freq: Once | OROMUCOSAL | Status: AC
  Administered 2020-10-11: 15 mL via OROMUCOSAL
  Filled 2020-10-11: qty 15

## 2020-10-11 MED ORDER — FENTANYL CITRATE (PF) 250 MCG/5ML IJ SOLN
INTRAMUSCULAR | Status: AC
  Filled 2020-10-11: qty 5

## 2020-10-11 MED ORDER — FENTANYL CITRATE (PF) 100 MCG/2ML IJ SOLN
INTRAMUSCULAR | Status: DC | PRN
  Administered 2020-10-11: 50 ug via INTRAVENOUS
  Administered 2020-10-11: 100 ug via INTRAVENOUS
  Administered 2020-10-11 (×2): 50 ug via INTRAVENOUS

## 2020-10-11 MED ORDER — NITROGLYCERIN 0.4 MG SL SUBL: 0.4000 mg | SUBLINGUAL_TABLET | SUBLINGUAL | Status: DC | PRN

## 2020-10-11 MED ORDER — MENTHOL 3 MG MT LOZG: 1.0000 | LOZENGE | OROMUCOSAL | Status: DC | PRN

## 2020-10-11 MED ORDER — ONDANSETRON HCL 4 MG/2ML IJ SOLN: 4.0000 mg | Freq: Four times a day (QID) | INTRAMUSCULAR | Status: DC | PRN

## 2020-10-11 MED ORDER — ROCURONIUM BROMIDE 10 MG/ML (PF) SYRINGE
PREFILLED_SYRINGE | INTRAVENOUS | Status: AC
  Filled 2020-10-11: qty 10

## 2020-10-11 MED ORDER — GLIPIZIDE 5 MG PO TABS
10.0000 mg | ORAL_TABLET | Freq: Two times a day (BID) | ORAL | Status: DC
  Administered 2020-10-12 – 2020-10-14 (×4): 10 mg via ORAL
  Filled 2020-10-11 (×4): qty 2

## 2020-10-11 MED ORDER — SODIUM CHLORIDE 0.9% FLUSH
3.0000 mL | Freq: Two times a day (BID) | INTRAVENOUS | Status: DC
  Administered 2020-10-11 – 2020-10-14 (×4): 3 mL via INTRAVENOUS

## 2020-10-11 MED ORDER — MORPHINE SULFATE (PF) 2 MG/ML IV SOLN
2.0000 mg | INTRAVENOUS | Status: DC | PRN
  Administered 2020-10-11 – 2020-10-12 (×2): 2 mg via INTRAVENOUS
  Filled 2020-10-11 (×2): qty 1

## 2020-10-11 MED ORDER — ROCURONIUM BROMIDE 10 MG/ML (PF) SYRINGE
PREFILLED_SYRINGE | INTRAVENOUS | Status: DC | PRN
  Administered 2020-10-11 (×2): 30 mg via INTRAVENOUS
  Administered 2020-10-11: 20 mg via INTRAVENOUS
  Administered 2020-10-11: 30 mg via INTRAVENOUS
  Administered 2020-10-11: 20 mg via INTRAVENOUS
  Administered 2020-10-11: 40 mg via INTRAVENOUS
  Administered 2020-10-11: 60 mg via INTRAVENOUS

## 2020-10-11 MED ORDER — DEXAMETHASONE SODIUM PHOSPHATE 10 MG/ML IJ SOLN
INTRAMUSCULAR | Status: AC
  Filled 2020-10-11: qty 1

## 2020-10-11 MED ORDER — LIDOCAINE 2% (20 MG/ML) 5 ML SYRINGE
INTRAMUSCULAR | Status: AC
  Filled 2020-10-11: qty 5

## 2020-10-11 MED ORDER — BUPIVACAINE LIPOSOME 1.3 % IJ SUSP
INTRAMUSCULAR | Status: AC
  Filled 2020-10-11: qty 20

## 2020-10-11 MED ORDER — CEFAZOLIN SODIUM-DEXTROSE 2-4 GM/100ML-% IV SOLN
2.0000 g | INTRAVENOUS | Status: DC
  Filled 2020-10-11: qty 100

## 2020-10-11 MED ORDER — KETAMINE HCL 10 MG/ML IJ SOLN
INTRAMUSCULAR | Status: DC | PRN
  Administered 2020-10-11: 20 mg via INTRAVENOUS
  Administered 2020-10-11 (×3): 10 mg via INTRAVENOUS

## 2020-10-11 MED ORDER — THROMBIN 5000 UNITS EX SOLR: OROMUCOSAL | Status: DC | PRN

## 2020-10-11 MED ORDER — POTASSIUM 99 MG PO TABS: 99.0000 mg | ORAL_TABLET | Freq: Every day | ORAL | Status: DC

## 2020-10-11 MED ORDER — ISOSORBIDE MONONITRATE ER 60 MG PO TB24
60.0000 mg | ORAL_TABLET | Freq: Every day | ORAL | Status: DC
  Administered 2020-10-12 – 2020-10-14 (×3): 60 mg via ORAL
  Filled 2020-10-11 (×3): qty 1

## 2020-10-11 MED ORDER — VITAMIN D 25 MCG (1000 UNIT) PO TABS
2000.0000 [IU] | ORAL_TABLET | Freq: Every day | ORAL | Status: DC
  Administered 2020-10-12 – 2020-10-14 (×3): 2000 [IU] via ORAL
  Filled 2020-10-11 (×3): qty 2

## 2020-10-11 MED ORDER — LIDOCAINE-EPINEPHRINE 1 %-1:100000 IJ SOLN
INTRAMUSCULAR | Status: AC
  Filled 2020-10-11: qty 1

## 2020-10-11 MED ORDER — PROPOFOL 10 MG/ML IV BOLUS
INTRAVENOUS | Status: AC
  Filled 2020-10-11: qty 40

## 2020-10-11 MED ORDER — OXYCODONE HCL 5 MG/5ML PO SOLN: 5.0000 mg | Freq: Once | ORAL | Status: AC | PRN

## 2020-10-11 MED ORDER — PHENYLEPHRINE 40 MCG/ML (10ML) SYRINGE FOR IV PUSH (FOR BLOOD PRESSURE SUPPORT)
PREFILLED_SYRINGE | INTRAVENOUS | Status: DC | PRN
  Administered 2020-10-11: 120 ug via INTRAVENOUS
  Administered 2020-10-11: 160 ug via INTRAVENOUS

## 2020-10-11 MED ORDER — ORAL CARE MOUTH RINSE: 15.0000 mL | Freq: Once | OROMUCOSAL | Status: AC

## 2020-10-11 MED ORDER — LIDOCAINE 2% (20 MG/ML) 5 ML SYRINGE
INTRAMUSCULAR | Status: DC | PRN
  Administered 2020-10-11: 40 mg via INTRAVENOUS

## 2020-10-11 MED ORDER — FLEET ENEMA 7-19 GM/118ML RE ENEM: 1.0000 | ENEMA | Freq: Once | RECTAL | Status: DC | PRN

## 2020-10-11 MED ORDER — ALBUTEROL SULFATE (2.5 MG/3ML) 0.083% IN NEBU: 2.5000 mg | INHALATION_SOLUTION | Freq: Three times a day (TID) | RESPIRATORY_TRACT | Status: DC | PRN

## 2020-10-11 SURGICAL SUPPLY — 92 items
BAND RUBBER #18 3X1/16 STRL (MISCELLANEOUS) IMPLANT
BASKET BONE COLLECTION (BASKET) ×3 IMPLANT
BENZOIN TINCTURE PRP APPL 2/3 (GAUZE/BANDAGES/DRESSINGS) IMPLANT
BLADE CLIPPER SURG (BLADE) ×3 IMPLANT
BUR CARBIDE MATCH 3.0 (BURR) ×3 IMPLANT
BUR MATCHSTICK NEURO 3.0 LAGG (BURR) IMPLANT
BUR PRECISION FLUTE 5.0 (BURR) IMPLANT
BUR PRECISION FLUTE 6.0 (BURR) ×3 IMPLANT
BUR ROUND FLUTED 5 RND (BURR) ×3 IMPLANT
CAGE SABLE 10X26 6-12 8D (Cage) ×3 IMPLANT
CAGE SABLE 10X30 6-12 8D (Cage) ×3 IMPLANT
CANISTER SUCT 3000ML PPV (MISCELLANEOUS) ×3 IMPLANT
CAP LOCKING THREADED (Cap) ×18 IMPLANT
CNTNR URN SCR LID CUP LEK RST (MISCELLANEOUS) ×2 IMPLANT
CONT SPEC 4OZ STRL OR WHT (MISCELLANEOUS) ×3
COVER BACK TABLE 60X90IN (DRAPES) ×3 IMPLANT
COVER WAND RF STERILE (DRAPES) IMPLANT
DECANTER SPIKE VIAL GLASS SM (MISCELLANEOUS) ×3 IMPLANT
DERMABOND ADVANCED (GAUZE/BANDAGES/DRESSINGS) ×1
DERMABOND ADVANCED .7 DNX12 (GAUZE/BANDAGES/DRESSINGS) ×2 IMPLANT
DRAIN JACKSON PRATT 10MM FLAT (MISCELLANEOUS) IMPLANT
DRAPE 3/4 80X56 (DRAPES) ×3 IMPLANT
DRAPE C-ARM 42X72 X-RAY (DRAPES) ×3 IMPLANT
DRAPE C-ARMOR (DRAPES) ×3 IMPLANT
DRAPE LAPAROTOMY 100X72X124 (DRAPES) ×3 IMPLANT
DRAPE MICROSCOPE LEICA (MISCELLANEOUS) IMPLANT
DRAPE SURG 17X23 STRL (DRAPES) ×3 IMPLANT
DRSG AQUACEL AG ADV 3.5X10 (GAUZE/BANDAGES/DRESSINGS) ×3 IMPLANT
DRSG MEPILEX BORDER 4X4 (GAUZE/BANDAGES/DRESSINGS) IMPLANT
DRSG OPSITE POSTOP 3X4 (GAUZE/BANDAGES/DRESSINGS) IMPLANT
DRSG OPSITE POSTOP 4X6 (GAUZE/BANDAGES/DRESSINGS) IMPLANT
DURAPREP 26ML APPLICATOR (WOUND CARE) ×3 IMPLANT
ELECT BLADE INSULATED 6.5IN (ELECTROSURGICAL)
ELECT COATED BLADE 2.86 ST (ELECTRODE) ×3 IMPLANT
ELECT REM PT RETURN 9FT ADLT (ELECTROSURGICAL) ×3
ELECT SOLID GEL RDN PRO-PADZ (MISCELLANEOUS) ×3
ELECTRODE BLDE INSULATED 6.5IN (ELECTROSURGICAL) IMPLANT
ELECTRODE REM PT RTRN 9FT ADLT (ELECTROSURGICAL) ×2 IMPLANT
ELECTRODE SOLI GEL RDN PROPADZ (MISCELLANEOUS) ×2 IMPLANT
EVACUATOR 1/8 PVC DRAIN (DRAIN) ×3 IMPLANT
EVACUATOR 3/16  PVC DRAIN (DRAIN)
EVACUATOR 3/16 PVC DRAIN (DRAIN) IMPLANT
EVACUATOR SILICONE 100CC (DRAIN) IMPLANT
GAUZE 4X4 16PLY RFD (DISPOSABLE) IMPLANT
GAUZE SPONGE 4X4 12PLY STRL (GAUZE/BANDAGES/DRESSINGS) ×3 IMPLANT
GLOVE BIOGEL PI IND STRL 7.5 (GLOVE) ×10 IMPLANT
GLOVE BIOGEL PI INDICATOR 7.5 (GLOVE) ×5
GLOVE ECLIPSE 7.5 STRL STRAW (GLOVE) ×6 IMPLANT
GLOVE EXAM NITRILE XL STR (GLOVE) IMPLANT
GOWN STRL REUS W/ TWL LRG LVL3 (GOWN DISPOSABLE) ×4 IMPLANT
GOWN STRL REUS W/ TWL XL LVL3 (GOWN DISPOSABLE) ×8 IMPLANT
GOWN STRL REUS W/TWL 2XL LVL3 (GOWN DISPOSABLE) IMPLANT
GOWN STRL REUS W/TWL LRG LVL3 (GOWN DISPOSABLE) ×6
GOWN STRL REUS W/TWL XL LVL3 (GOWN DISPOSABLE) ×12
GRAFT DURAGEN MATRIX 1WX1L (Tissue) ×3 IMPLANT
GRAFT TRINITY ELITE LGE HUMAN (Tissue) ×3 IMPLANT
HEMOSTAT POWDER KIT SURGIFOAM (HEMOSTASIS) ×3 IMPLANT
IMPL SPINE MCS 6.5 5.5X35 (Neuro Prosthesis/Implant) ×4 IMPLANT
IMPLANT SPINE MCS 6.5 5.5X35 (Neuro Prosthesis/Implant) ×6 IMPLANT
KIT BASIN OR (CUSTOM PROCEDURE TRAY) ×3 IMPLANT
KIT TURNOVER KIT B (KITS) ×3 IMPLANT
MARKER SKIN DUAL TIP RULER LAB (MISCELLANEOUS) ×3 IMPLANT
MILL MEDIUM DISP (BLADE) IMPLANT
NEEDLE HYPO 18GX1.5 BLUNT FILL (NEEDLE) IMPLANT
NEEDLE HYPO 21X1.5 SAFETY (NEEDLE) ×3 IMPLANT
NEEDLE HYPO 22GX1.5 SAFETY (NEEDLE) ×6 IMPLANT
NEEDLE SPNL 18GX3.5 QUINCKE PK (NEEDLE) ×3 IMPLANT
NS IRRIG 1000ML POUR BTL (IV SOLUTION) ×3 IMPLANT
PACK LAMINECTOMY NEURO (CUSTOM PROCEDURE TRAY) ×3 IMPLANT
PAD ARMBOARD 7.5X6 YLW CONV (MISCELLANEOUS) ×6 IMPLANT
PATTIES SURGICAL .5 X.5 (GAUZE/BANDAGES/DRESSINGS) ×3 IMPLANT
PATTIES SURGICAL 1X1 (DISPOSABLE) ×6 IMPLANT
ROD 70MM SPINAL (Rod) ×6 IMPLANT
SCREW CORTICAL 6.5-5.5X40MM (Screw) ×12 IMPLANT
SCREW PA THRD CREO TULIP 5.5X4 (Head) ×18 IMPLANT
SEALANT ADHERUS EXTEND TIP (MISCELLANEOUS) ×3 IMPLANT
SPONGE LAP 4X18 RFD (DISPOSABLE) ×3 IMPLANT
SPONGE SURGIFOAM ABS GEL 100 (HEMOSTASIS) IMPLANT
SPONGE SURGIFOAM ABS GEL SZ50 (HEMOSTASIS) IMPLANT
STAPLER VISISTAT 35W (STAPLE) ×3 IMPLANT
STRIP CLOSURE SKIN 1/2X4 (GAUZE/BANDAGES/DRESSINGS) IMPLANT
SUT MNCRL AB 4-0 PS2 18 (SUTURE) ×3 IMPLANT
SUT PROLENE 6 0 BV (SUTURE) ×9 IMPLANT
SUT VIC AB 0 CT1 18XCR BRD8 (SUTURE) ×6 IMPLANT
SUT VIC AB 0 CT1 8-18 (SUTURE) ×9
SUT VIC AB 2-0 CP2 18 (SUTURE) ×9 IMPLANT
SYR 30ML LL (SYRINGE) ×6 IMPLANT
SYR 3ML LL SCALE MARK (SYRINGE) IMPLANT
TOWEL GREEN STERILE (TOWEL DISPOSABLE) ×3 IMPLANT
TOWEL GREEN STERILE FF (TOWEL DISPOSABLE) ×3 IMPLANT
TRAY FOLEY MTR SLVR 16FR STAT (SET/KITS/TRAYS/PACK) ×3 IMPLANT
WATER STERILE IRR 1000ML POUR (IV SOLUTION) ×3 IMPLANT

## 2020-10-11 NOTE — H&P (Signed)
CC: back pain and bilateral leg pain  HPI:     Patient is a 60 y.o. male presents with severe back pain and bilateral leg pain, worse on the right. His back pain and leg pain are worse with activity. PT and multiple attempts at injection did not help his pain.  Since his clinic visit in March, his symptoms have slightly progressed, but not changed in character.    Patient Active Problem List   Diagnosis Date Noted  . Lumbar radiculopathy 09/27/2020   Past Medical History:  Diagnosis Date  . AICD (automatic cardioverter/defibrillator) present 2022  . Asthma   . Cancer ([place])    "skin cancer"  . CHF (congestive heart failure) ([place])   . Depression 07/15/2004  . Diabetes mellitus without complication ([place])   . GERD (gastroesophageal reflux disease)   . Hypertension   . MI (myocardial infarction) ([place])   . Presence of permanent cardiac pacemaker 2022    Past Surgical History:  Procedure Laterality Date  . [place], [place]  2022  . CARDIAC DEFIBRILLATOR PLACEMENT    . CARDIAC DEFIBRILLATOR PLACEMENT    . CORONARY ARTERY BYPASS GRAFT  2016  . INSERT / REPLACE / REMOVE PACEMAKER  02/24/2019  . PACEMAKER IMPLANT    . TONSILLECTOMY      Medications Prior to Admission  Medication Sig Dispense Refill Last Dose  . Albuterol Sulfate (PROAIR RESPICLICK) 762 (90 Base) MCG/ACT AEPB Inhale 2 puffs into the lungs every 8 (eight) hours as needed for shortness of breath.   Past Month at Unknown time  . BAYER LOW DOSE 81 MG chewable tablet Chew 81 mg by mouth daily.   Past Week at Unknown time  . Cholecalciferol 50 MCG (2000 UT) CAPS Take 2,000 Units by mouth daily.   Past Week at Unknown time  . clopidogrel (PLAVIX) 75 MG tablet Take 75 mg by mouth daily.   Past Week at Unknown time  . ENTRESTO 97-103 MG Take 1 tablet by mouth 2 (two) times daily.   10/10/2020 at Unknown time  . FLUoxetine (PROZAC) 40 MG capsule Take 40 mg by mouth daily.   10/11/2020  at 0300  . furosemide (LASIX) 40 MG tablet Take 40 mg by mouth 2 (two) times daily.   10/10/2020 at Unknown time  . gabapentin (NEURONTIN) 300 MG capsule Take 600 mg by mouth 3 (three) times daily.   10/11/2020 at 0300  . glipiZIDE (GLUCOTROL) 10 MG tablet Take 10 mg by mouth 2 (two) times daily.   10/10/2020 at Unknown time  . isosorbide mononitrate (IMDUR) 60 MG 24 hr tablet Take 60 mg by mouth daily.   10/10/2020 at Unknown time  . JARDIANCE 25 MG TABS tablet Take 25 mg by mouth daily.   10/10/2020 at Unknown time  . LANTUS SOLOSTAR 100 UNIT/ML Solostar Pen Inject 72 Units into the skin at bedtime.   Past Week at Unknown time  . metFORMIN (GLUCOPHAGE) 1000 MG tablet Take 1,000 mg by mouth 2 (two) times daily.   10/10/2020 at Unknown time  . metoprolol (TOPROL-XL) 200 MG 24 hr tablet Take 200 mg by mouth daily.   10/11/2020 at 0300  . nitroGLYCERIN (NITROSTAT) 0.4 MG SL tablet Place 0.4 mg under the tongue every 2 (two) hours as needed.     Marland Kitchen NOVOLOG FLEXPEN 100 UNIT/ML FlexPen Inject 30 Units into the skin 3 (three) times daily before meals.   10/10/2020 at Unknown time  .  OVER THE COUNTER MEDICATION Take 1 capsule by mouth daily. Move MD   Past Week at Unknown time  . Potassium 99 MG TABS Take 99 mg by mouth daily.   Past Week at Unknown time  . pravastatin (PRAVACHOL) 80 MG tablet Take 80 mg by mouth daily.   10/11/2020 at 0300  . spironolactone (ALDACTONE) 25 MG tablet Take 25 mg by mouth daily.   10/10/2020 at Unknown time  . traMADol (ULTRAM) 50 MG tablet Take 1 tablet (50 mg total) by mouth every 6 (six) hours as needed. (Patient not taking: Reported on 10/02/2020) 15 tablet 0 Not Taking at Unknown time   Allergies  Allergen Reactions  . Ticagrelor Nausea Only and Swelling    Bleeding/    . Lisinopril Cough    Social History   Tobacco Use  . Smoking status: Former Research scientist (life sciences)  . Smokeless tobacco: Never Used  Substance Use Topics  . Alcohol use: Never    History reviewed. No pertinent family  history.   Review of Systems Pertinent items noted in HPI and remainder of comprehensive ROS otherwise negative.  Objective:   Patient Vitals for the past 8 hrs:  BP Temp Temp src Pulse Resp SpO2 Height Weight  10/11/20 0543 (!) 145/81 98.4 F (36.9 C) Oral 84 18 95 % 5\' 10"  (1.778 m) 120.2 kg   No intake/output data recorded. No intake/output data recorded.      General : Alert, cooperative, no distress, appears stated age   Head:  Normocephalic/atraumatic    Eyes: PERRL, conjunctiva/corneas clear, EOM's intact. Fundi could not be visualized Neck: Supple Chest:  Respirations unlabored Chest wall: no tenderness or deformity Heart: Regular rate and rhythm Abdomen: Soft, nontender and nondistended Extremities: warm and well-perfused Skin: normal turgor, color and texture Neurologic:  Alert, oriented x 3.  Eyes open spontaneously. PERRL, EOMI, VFC, no facial droop. V1-3 intact.  No dysarthria, tongue protrusion symmetric.  CNII-XII intact. Normal strength, sensation and reflexes throughout.  No pronator drift, full strength in legs. Dysesthetic pains in R>L leg.  + SLR       Data Review CBC:  Lab Results  Component Value Date   WBC 7.9 10/09/2020   RBC 5.61 10/09/2020   BMP:  Lab Results  Component Value Date   GLUCOSE 102 (H) 10/11/2020   CO2 27 10/09/2020   BUN 22 (H) 10/11/2020   CREATININE 1.00 10/11/2020   CALCIUM 10.0 10/09/2020   Radiology review:  Severe stenosis at L3-4, L4-5 with severe facet arthropathy and disc space collapse,  Moderate sized R L5-S1 disc herniation.  Assessment:   Active Problems:   Lumbar radiculopathy   Plan:   - L3-4, L4-5 TLIFs, possible R L5-S1 microdiscectomy -  I discussed in detail the procedure with Taniela.  PRisks, benefits, alternatives, and expected convalescence was discussed.  Risks discussed included but were not limited to bleeding, pain, infection, scar, pseudoarthrosis, adjacent segment disease, cerebrospinal  fluid leak, neurologic deficit, paralysis, and death.  I also discussed with her the possibility of blood transfusion during surgery and consent to transfuse blood products if necessary was also obtained.  All questions and concerns were answered and understanding and agreement with the plan was verbalized.

## 2020-10-11 NOTE — Anesthesia Procedure Notes (Signed)
Procedure Name: Intubation Date/Time: 10/11/2020 9:06 AM Performed by: Jenne Campus, CRNA Pre-anesthesia Checklist: Patient identified, Emergency Drugs available, Suction available and Patient being monitored Patient Re-evaluated:Patient Re-evaluated prior to induction Oxygen Delivery Method: Circle System Utilized Preoxygenation: Pre-oxygenation with 100% oxygen Induction Type: IV induction Ventilation: Mask ventilation without difficulty Laryngoscope Size: Miller and 3 Grade View: Grade I Tube type: Oral Tube size: 8.0 mm Number of attempts: 1 Airway Equipment and Method: Stylet and Oral airway Placement Confirmation: ETT inserted through vocal cords under direct vision,  positive ETCO2 and breath sounds checked- equal and bilateral Secured at: 23 cm Tube secured with: Tape Dental Injury: Teeth and Oropharynx as per pre-operative assessment

## 2020-10-11 NOTE — Progress Notes (Signed)
Patient came to floor from PACU, no orders. Dr Marcello Moores notified.   Ilea Hilton, Tivis Ringer, RN

## 2020-10-11 NOTE — Progress Notes (Signed)
Bedside shift report complete. Received patient awake,alert/orientedx4 and able to verbalize needs. NAD noted; respirations easy/even on room air. Continuous pulse ox on. Patient in pain at this time to check dressing. Hemovac drain noted to back. Movement/sensation to all extremities noted. Patient educated on [facility] degree per orders. Whiteboard updated. All safety measures in place and personal belongings within reach.

## 2020-10-11 NOTE — Transfer of Care (Signed)
Immediate Anesthesia Transfer of Care Note  Patient: Jorge Patrick  Procedure(s) Performed: Transforaminal Lumbar Interbody Fusion Lumbar three-four, Lumbar four-five (N/A Back)  Patient Location: PACU  Anesthesia Type:General  Level of Consciousness: awake, oriented and patient cooperative  Airway & Oxygen Therapy: Patient Spontanous Breathing and Patient connected to face mask oxygen  Post-op Assessment: Report given to RN and Post -op Vital signs reviewed and stable  Post vital signs: Reviewed  Last Vitals:  Vitals Value Taken Time  BP 98/69 10/11/20 1509  Temp    Pulse 100 10/11/20 1514  Resp 14 10/11/20 1514  SpO2 99 % 10/11/20 1514  Vitals shown include unvalidated device data.  Last Pain:  Vitals:   10/11/20 0635  TempSrc:   PainSc: 8       Patients Stated Pain Goal: 4 (21/11/73 5670)  Complications: No complications documented.

## 2020-10-11 NOTE — Progress Notes (Signed)
MD Marcello Moores notified that patient has no active orders at this time.  2026: New orders were placed, signed and released.

## 2020-10-11 NOTE — Op Note (Signed)
NEUROSURGERY OPERATIVE NOTE   PREOP DIAGNOSIS:  Lumbar radiculopathy and severe stenosis   POSTOP DIAGNOSIS: Lumbar radiculopathy and severe stenosis  PROCEDURE: 1. Left Transforaminal lumbar interbody arthrodesis L3-4 2. Right transforaminal lumbar interbody arthrodesis L4-5 3. Posterior/posterolateral arthrodesis L3-4 4. Posterior/posterolateral arthrodesis L4-5 5. L3-4, L4-5  laminectomy with facetectomy for decompression of exiting nerve roots, more than would be required for placement of interbody graft 6. Placement of Expandable interbody device - L3-4 7. Placement of Expandable interbody device - L4-5 8. Posterior segmental instrumentation using cortical pedicle screws at L3-4-5 9. Use of locally harvested bone autograft 10. Use of non-structural bone allograft  11. Use of spinal neuronavigation  SURGEON: Dr. Duffy Rhody, MD  ASSISTANT: none  ANESTHESIA: General Endotracheal  EBL: 500 ml  IMPLANTS: Globus Creo pedicle screws 6.5/5.5 x 35 mm screws x2 at L3, 6.5/5.5 x 40 mm screws at L4 and L5 70 mm pre-bent rod Sable 10 x 26 expandable cage with 8 degree lordosis at L34 Sable 10 x 30 mm expandable cage with 8 degree lordosis at L4-5  SPECIMENS: None  DRAINS: Subfascial drain  COMPLICATIONS: None immediate  CONDITION: Hemodynamically stable to PACU  HISTORY: Jorge Patrick is a 60 y.o. male who has been followed in the outpatient clinic with severe back pain and right greater than left leg pain . Multiple conservative treatments including injections and physical therapy were attempted without significant improvement and we ultimately elected to proceed with surgical decompression and fusion.  He did have a right L5-S1 partially calcified paracentral disc herniation but it did not appear to be causing significant nerve impingement as he did not have a clear right S1 radiculopathy.  To reduce the scope and extent of surgery, I did not recommend treatment at this  level.  Risks, benefits, and alternative treatments were reviewed in detail in the office.  Risks discussed included, but were not limited to, bleeding, pain, infection, scar, pseudoarthrosis, adjacent segment disease, spinal fluid leak, neurologic deficit, coma, and death.  After all questions were answered, informed consent was obtained and witnessed.  PROCEDURE IN DETAIL: The patient was brought to the operating room via stretcher. After induction of general anesthesia, the patient was positioned on the operative table in the prone position. All pressure points were meticulously padded. Incision was then marked out and prepped and draped in the usual sterile fashion.  After timeout was conducted, skin was infiltrated with local anesthetic. Skin incision was then made sharply and Bovie electrocautery was used to dissect the subcutaneous tissue until the lumbodorsal fascia was identified and incised. The muscle was then elevated in the subperiosteal plane and the L3, L4, and L5 lamina and facet complexes were identified. Self-retaining retractors were then placed. Spinous process clamp was placed on the L2 spinous process and O-arm was used to obtain intraoperative images for navigation.  Localization was confirmed.   At this point, the entry points for bilateral L3, L4, and L5 cortical pedicle screws were identified using standard anatomic landmarks and navigated Midas Rex drill. Pilot holes were then drilled and tapped to. Screws without tulip heads were then placed in the L3, L4, and L5 pedicles with good purchase.  At this point attention was turned to decompression. Partial L3, complete L4 and partial L5  laminectomy was completed with a high-speed drill and Kerrison punches.  Normal dura was identified.  A ball-tipped dissector was then used to identify the foramina bilaterally.  High-speed drill was used to cut across the pars interarticularis and  the inferior articulating processswas removed  bilaterally.  The exiting nerve roots and the traversing nerve roots were then identified.  I was then able to easily pass a ball dissector in the ventral epidural space and underneath the bilateral L3, L4, and L5 nerves indicating good decompression.  The patient had severely overgrown facets, with several areas of epidural scar.  The medial facet of right L4-5 had significant adhesions to the dura and an incidental durotomy was noted.  Fortunately, the arachnoid remained intact except for a small pinhole.  It was repaired primarily with interrupted 6-0 Prolene stitches, with no evidence of leakage on Valsalva.  Disc space at right L4-5 was then identified, incised, and using a combination of shavers, curettes and rongeurs, complete discectomy was completed.  The disc space was very collapsed but was subsequently expanded well with the disc work. Endplates were prepared, and bone harvested during decompression was mixed with allograft and packed into the interspace. A size 6 expandable cage measuring 10 x 30 cage was tapped into place under x-ray guidance.cage was then expanded until snug.  Good position was confirmed with fluoroscopy.  Disc space at left was then identified, incised, and entered with a osteotome as it was severely compressed.  Using a combination of shavers, curettes and rongeurs, complete discectomy was completed. Endplates were prepared, and bone harvested during decompression was mixed with allograft and packed into the interspace. A size 6 expandable cage measuring 10 x 26 cage was tapped into place under x-ray guidance.  cage was then expanded until snug.  Good position was confirmed with fluoroscopy.  Prebent lordotic rod was then sized and placed into the pedicle screws. Set screws were placed and final tightened. Final AP and lateral fluoroscopic images confirmed good position.  Hemostasis was secured and confirmed with bipolar cautery and morcellized gelfoam with thrombin. The  wound was then irrigated with copious amounts of saline.  The lateral gutters were decorticated and packed with morselized allograft and autograft.  A small piece of DuraGen plus was placed over the dural stitch line and Adherus dural spray was used to secure it in place.  A medium Hemovac drain was placed in the subfascial space and tunneled out the skin secured with a stitch.  Then closed in standard fashion using a combination of interrupted 0 and 3-0 Vicryl stitches in the muscular, fascial, and subcutaneous layers. Skin was then closed using standard Dermabond. Sterile dressing was then applied. The patient was then transferred to the stretcher, extubated, and taken to the postanesthesia care unit in stable hemodynamic condition.  At the end of the case all sponge, needle, cottonoid, and instrument counts were correct.

## 2020-10-11 NOTE — Anesthesia Postprocedure Evaluation (Signed)
Anesthesia Post Note  Patient: Jorge Patrick  Procedure(s) Performed: Transforaminal Lumbar Interbody Fusion Lumbar three-four, Lumbar four-five (N/A Back)     Patient location during evaluation: PACU Anesthesia Type: General Level of consciousness: awake and alert Pain management: pain level controlled Vital Signs Assessment: post-procedure vital signs reviewed and stable Respiratory status: spontaneous breathing, nonlabored ventilation, respiratory function stable and patient connected to nasal cannula oxygen Cardiovascular status: blood pressure returned to baseline and stable Postop Assessment: no apparent nausea or vomiting Anesthetic complications: no   No complications documented.  Last Vitals:  Vitals:   10/11/20 1710 10/11/20 1725  BP:  129/70  Pulse: 86 84  Resp:    Temp:    SpO2: 99% 98%                   Audry Pili

## 2020-10-12 ENCOUNTER — Encounter (HOSPITAL_COMMUNITY): Payer: Self-pay | Admitting: Neurosurgery

## 2020-10-12 ENCOUNTER — Inpatient Hospital Stay (HOSPITAL_COMMUNITY): Payer: BC Managed Care – PPO

## 2020-10-12 LAB — GLUCOSE, CAPILLARY
Glucose-Capillary: 194 mg/dL — ABNORMAL HIGH (ref 70–99)
Glucose-Capillary: 169 mg/dL — ABNORMAL HIGH (ref 70–99)
Glucose-Capillary: 201 mg/dL — ABNORMAL HIGH (ref 70–99)
Glucose-Capillary: 196 mg/dL — ABNORMAL HIGH (ref 70–99)

## 2020-10-12 LAB — CBC
HCT: 39.4 % (ref 39.0–52.0)
Hemoglobin: 12.9 g/dL — ABNORMAL LOW (ref 13.0–17.0)
MCH: 30.3 pg (ref 26.0–34.0)
MCHC: 32.7 g/dL (ref 30.0–36.0)
MCV: 92.5 fL (ref 80.0–100.0)
Platelets: 177 K/uL (ref 150–400)
RBC: 4.26 MIL/uL (ref 4.22–5.81)
RDW: 13.4 % (ref 11.5–15.5)
WBC: 11 K/uL — ABNORMAL HIGH (ref 4.0–10.5)
nRBC: 0 % (ref 0.0–0.2)

## 2020-10-12 LAB — BASIC METABOLIC PANEL WITH GFR
Anion gap: 8 (ref 5–15)
BUN: 20 mg/dL (ref 6–20)
CO2: 23 mmol/L (ref 22–32)
Calcium: 8.1 mg/dL — ABNORMAL LOW (ref 8.9–10.3)
Chloride: 103 mmol/L (ref 98–111)
Creatinine, Ser: 1.06 mg/dL (ref 0.61–1.24)
GFR, Estimated: >60 mL/min
Glucose, Bld: 217 mg/dL — ABNORMAL HIGH (ref 70–99)
Potassium: 4.6 mmol/L (ref 3.5–5.1)
Sodium: 134 mmol/L — ABNORMAL LOW (ref 135–145)

## 2020-10-12 NOTE — Evaluation (Signed)
Physical Therapy Evaluation Patient Details Name: Jorge Patrick MRN: [id number] DOB: 01/04/1961 Today's Date: 10/12/2020   History of Present Illness  Patient is a 60 y.o. male presents with severe back pain and bilateral leg pain, worse on the right. His back pain and leg pain are worse with activity. 5/25  Transforaminal Lumbar Interbody Fusion Lumbar 3-4, Lumbar 4-5.  Clinical Impression  Patient received in bed, reports severe pain. RN notified for patient needing pain medicine. He is agreeable to PT session. Requires min assist for side lying to sit. Educated patient on donning brace correctly.  Sit to stand with min assist. Patient ambulated 5 feet with RW and then having a lot of pain and feeling like BS dropped so returned to recliner. Patient given applesauce and rested until he felt better. Eating lunch when I left. Patient will continue to benefit from skilled PT while here to improve functional mobility and independence for safe return home.        Follow Up Recommendations Home health PT;Supervision for mobility/OOB    Equipment Recommendations  Rolling walker with 5" wheels    Recommendations for Other Services       Precautions / Restrictions Precautions Precautions: Fall;Back Required Braces or Orthoses: Spinal Brace Spinal Brace: Lumbar corset;Applied in sitting position;Other (comment) Spinal Brace Comments: can go to bathroom without brace Restrictions Weight Bearing Restrictions: No      Mobility  Bed Mobility Overal bed mobility: Needs Assistance Bed Mobility: Rolling;Sidelying to Sit Rolling: Modified independent (Device/Increase time) Sidelying to sit: Min assist       General bed mobility comments: assist to raise trunk to seated position due to pain    Transfers Overall transfer level: Needs assistance Equipment used: Rolling walker (2 wheeled) Transfers: Sit to/from Stand Sit to Stand: Min guard         General transfer comment: incrased  time, vc for safe hand positioning  Ambulation/Gait Ambulation/Gait assistance: Min guard Gait Distance (Feet): 10 Feet Assistive device: Rolling walker (2 wheeled) Gait Pattern/deviations: Step-through pattern;Decreased step length - right;Decreased step length - left Gait velocity: decr   General Gait Details: patient felt dizzy and like his BS was low, so returned to Physicist, medical    Modified Rankin (Stroke Patients Only)       Balance Overall balance assessment: Needs assistance Sitting-balance support: Feet supported Sitting balance-Leahy Scale: Good     Standing balance support: Bilateral upper extremity supported;During functional activity Standing balance-Leahy Scale: Fair Standing balance comment: benefits from B UE support at this time                             Pertinent Vitals/Pain Pain Assessment: 0-10 Pain Score: 9  Pain Location: back, hips and legs Pain Descriptors / Indicators: Discomfort;Sore;Grimacing;Guarding Pain Intervention(s): Monitored during session;RN gave pain meds during session;Repositioned    Home Living Family/patient expects to be discharged to:: Private residence Living Arrangements: Other relatives;Spouse/significant other Available Help at Discharge: Family;Available 24 hours/day Type of Home: House Home Access: Stairs to enter Entrance Stairs-Rails: Right Entrance Stairs-Number of Steps: 4 Home Layout: One level Home Equipment: None Additional Comments: Pt's wife works during the day; pt's brother coming to stay with pt for at least 1 week    Prior Function                 Hand Dominance  Extremity/Trunk Assessment   Upper Extremity Assessment Upper Extremity Assessment: Defer to OT evaluation    Lower Extremity Assessment Lower Extremity Assessment: Generalized weakness    Cervical / Trunk Assessment Cervical / Trunk Assessment: Normal;Other  exceptions Cervical / Trunk Exceptions: s/p lumbar sx  Communication   Communication: No difficulties  Cognition Arousal/Alertness: Awake/alert Behavior During Therapy: WFL for tasks assessed/performed Overall Cognitive Status: Within Functional Limits for tasks assessed                                        General Comments      Exercises     Assessment/Plan    PT Assessment Patient needs continued PT services  PT Problem List Decreased strength;Decreased mobility;Decreased activity tolerance;Decreased balance;Pain;Decreased knowledge of use of DME;Decreased knowledge of precautions;Decreased safety awareness       PT Treatment Interventions DME instruction;Therapeutic exercise;Gait training;Stair training;Functional mobility training;Therapeutic activities;Patient/family education    PT Goals (Current goals can be found in the Care Plan section)  Acute Rehab PT Goals Patient Stated Goal: to return home, decrease pain PT Goal Formulation: With patient Time For Goal Achievement: 10/17/20 Potential to Achieve Goals: Good    Frequency Min 5X/week   Barriers to discharge        Co-evaluation               AM-PAC PT "6 Clicks" Mobility  Outcome Measure Help needed turning from your back to your side while in a flat bed without using bedrails?: A Little Help needed moving from lying on your back to sitting on the side of a flat bed without using bedrails?: A Lot Help needed moving to and from a bed to a chair (including a wheelchair)?: A Little Help needed standing up from a chair using your arms (e.g., wheelchair or bedside chair)?: A Little Help needed to walk in hospital room?: A Little Help needed climbing 3-5 steps with a railing? : A Lot 6 Click Score: 16    End of Session   Activity Tolerance: Patient limited by pain;Treatment limited secondary to medical complications (Comment);Other (comment) (felt BS dropped, feeling dizzy and weak)    Nurse Communication: Mobility status;Patient requests pain meds PT Visit Diagnosis: Muscle weakness (generalized) (M62.81);Difficulty in walking, not elsewhere classified (R26.2);Other abnormalities of gait and mobility (R26.89);Pain Pain - part of body:  (back)    Time: 2707-8675 PT Time Calculation (min) (ACUTE ONLY): 30 min   Charges:   PT Evaluation $PT Eval Moderate Complexity: 1 Mod PT Treatments $Gait Training: 8-22 mins        [employer], PT, [facility] 10/12/20,12:06 PM

## 2020-10-12 NOTE — Plan of Care (Signed)
  Problem: Clinical Measurements: Goal: Will remain free from infection Outcome: Progressing Goal: Diagnostic test results will improve Outcome: Progressing   

## 2020-10-12 NOTE — Progress Notes (Signed)
Occupational Therapy Evaluation Patient Details Name: Jorge Patrick MRN: [id number] DOB: [birth year] Today's Date: 10/12/2020    History of Present Illness Patient is a 60 y.o. male presents with severe back pain and bilateral leg pain, worse on the right. His back pain and leg pain are worse with activity. 5/25  Transforaminal Lumbar Interbody Fusion Lumbar 3-4, Lumbar 4-5.   Clinical Impression   Jorge Patrick is doing well, 7/10 pain at the start of the session, willing to participate with therapy. Jorge Patrick reports that PTA he was inep in all ADL/IADLs including driving to [facility] and back several times per month to take care of his mother; he has 70 STE to enter at the front of his house or 4 STE at the back of his house; lives with his wife who works during the day & his brother has plans to stay with him 24/7 for at least a week. Upon evaluation Jorge Patrick demonstrated good ability to log roll and verbalized all back precautions. He is min guard/min A for bed mobility and min guard for sit<>stand with rw. Jorge Patrick's back brace was not in room at time of evaluation so functional mobility was limited. Jorge Patrick is set up for upper ADLs and mod A for lower ADLs. He would benefit from AE education prior to dc. Jorge Patrick benefits from acute OT to progress indep in ADLs. Recommend d/c to home with supervision for all ADLs and mobility, no OT follow up.    Follow Up Recommendations  No OT follow up;Supervision - Intermittent    Equipment Recommendations  Tub/shower bench       Precautions / Restrictions Precautions Precautions: Fall;Back Precaution Booklet Issued: Yes (comment) Precaution Comments: verbally reviewed all back precautions Required Braces or Orthoses: Spinal Brace Spinal Brace: Lumbar corset;Applied in sitting position;Other (comment) (may remove in bed, HOB limited to 20*, may leave brace off for quick trips to bathroom) Restrictions Weight Bearing Restrictions: No      Mobility Bed Mobility Overal  bed mobility: Needs Assistance Bed Mobility: Rolling;Sidelying to Sit;Sit to Sidelying Rolling: Min guard Sidelying to sit: Min assist     Sit to sidelying: Min assist General bed mobility comments: assist to elevate trunk up, and assist with LE back into bed    Transfers Overall transfer level: Needs assistance Equipment used: Rolling walker (2 wheeled) Transfers: Sit to/from Stand Sit to Stand: Min guard;From elevated surface         General transfer comment: incrased time, vc for safe hand positioning    Balance Overall balance assessment: Needs assistance Sitting-balance support: No upper extremity supported Sitting balance-Leahy Scale: Good     Standing balance support: Bilateral upper extremity supported Standing balance-Leahy Scale: Poor        ADL either performed or assessed with clinical judgement   ADL Overall ADL's : Needs assistance/impaired Eating/Feeding: Independent;Sitting   Grooming: Wash/dry hands;Applying deodorant;Oral care;Wash/dry face;Set up;Sitting   Upper Body Bathing: Set up;Sitting   Lower Body Bathing: Moderate assistance;Sit to/from stand   Upper Body Dressing : Set up;Sitting   Lower Body Dressing: Moderate assistance;Sit to/from stand   Toilet Transfer: Minimal assistance;RW;Stand-pivot;BSC   Toileting- Water quality scientist and Hygiene: Min guard;Sit to/from stand       Functional mobility during ADLs: Min guard;Rolling walker (functional mobility limited this session due to not having lumbar brace) General ADL Comments: Pt would benefit from AE to incrased lower body dressing indep  Pertinent Vitals/Pain Pain Assessment: 0-10 Pain Score: 7  Pain Location: back, hips and legs Pain Descriptors / Indicators: Discomfort;Sore Pain Intervention(s): Limited activity within patient's tolerance;Monitored during session     Hand Dominance Right   Extremity/Trunk Assessment Upper Extremity  Assessment Upper Extremity Assessment: Overall WFL for tasks assessed   Lower Extremity Assessment Lower Extremity Assessment: Defer to PT evaluation   Cervical / Trunk Assessment Cervical / Trunk Assessment: Normal;Other exceptions Cervical / Trunk Exceptions: s/p lumbar sx   Communication Communication Communication: No difficulties   Cognition Arousal/Alertness: Awake/alert Behavior During Therapy: WFL for tasks assessed/performed Overall Cognitive Status: Within Functional Limits for tasks assessed          General Comments  VSS on RA, wound vac intact, no lumbar brace in room at time of eval      Home Living Family/patient expects to be discharged to:: Private residence Living Arrangements: Spouse/significant other Available Help at Discharge: Family Type of Home: House Home Access: Stairs to enter Technical brewer of Steps: 15 (back enterance has 4 steps, rail on the right) Entrance Stairs-Rails: Can reach both Home Layout: One level     Bathroom Shower/Tub: Tub/shower unit;Door   [employer] Toilet: Handicapped height Bathroom Accessibility: Yes How Accessible: Accessible via walker Home Equipment: None   Additional Comments: Pt's wife works during the day; pt's brother coming to stay with pt for at least 1 week      Prior Functioning/Environment Level of Independence: Independent        Comments: Pt's mom is sick & she lives in [place], pt drives to take care of her ever week.        OT Problem List: Decreased strength;Decreased range of motion;Decreased activity tolerance;Decreased safety awareness;Decreased knowledge of use of DME or AE;Decreased knowledge of precautions;Pain      OT Treatment/Interventions: Self-care/ADL training;DME and/or AE instruction;Balance training;Patient/family education    OT Goals(Current goals can be found in the care plan section) Acute Rehab OT Goals Patient Stated Goal: home tomorrow OT Goal Formulation:  With patient Time For Goal Achievement: 10/26/20 Potential to Achieve Goals: Good ADL Goals Pt Will Perform Lower Body Bathing: with supervision;with adaptive equipment;sitting/lateral leans Pt Will Perform Lower Body Dressing: with supervision;with adaptive equipment;sit to/from stand Pt Will Transfer to Toilet: with modified independence;ambulating;regular height toilet Additional ADL Goal #1: Pt will indep recall 4/4 back precautions and demonstrated proper log rolling to prepare to safe transition into the home environment.  OT Frequency: Min 2X/week    AM-PAC OT "6 Clicks" Daily Activity     Outcome Measure Help from another person eating meals?: None Help from another person taking care of personal grooming?: A Little Help from another person toileting, which includes using toliet, bedpan, or urinal?: A Little Help from another person bathing (including washing, rinsing, drying)?: A Lot Help from another person to put on and taking off regular upper body clothing?: A Little Help from another person to put on and taking off regular lower body clothing?: A Lot 6 Click Score: 17   End of Session Equipment Utilized During Treatment: Gait belt;Rolling walker Nurse Communication: Mobility status;Other (comment) (need for brace, HOB limited to 20*)  Activity Tolerance: Patient tolerated treatment well Patient left: in bed;with call bell/phone within reach;with bed alarm set  OT Visit Diagnosis: Unsteadiness on feet (R26.81);Muscle weakness (generalized) (M62.81);Pain Pain - Right/Left:  (back) Pain - part of body: Leg (and back)  Time: 5364-6803 OT Time Calculation (min): 28 min Charges:  OT General Charges $OT Visit: 1 Visit OT Evaluation $OT Eval Moderate Complexity: 1 Mod OT Treatments $Self Care/Home Management : 8-22 mins    Avigail Pilling A Evalina Tabak 10/12/2020, 9:32 AM

## 2020-10-12 NOTE — Progress Notes (Signed)
Orthopedic Tech Progress Note Patient Details:  JERMINE BIBBEE 1960-10-23 [id number]  Ortho Devices Type of Ortho Device: Lumbar corsett Ortho Device/Splint Interventions: Ordered       Danton Sewer A Charline Hoskinson 10/12/2020, 9:46 AM

## 2020-10-12 NOTE — Progress Notes (Signed)
Subjective: Patient reports back pain improving.  No longer has numbness and pains in feet.  Will occasionally get bilateral leg pain.  Objective: Vital signs in last 24 hours: Temp:  [97.2 F (36.2 C)-99.4 F (37.4 C)] 98.2 F (36.8 C) (05/26 0738) Pulse Rate:  [81-98] 88 (05/26 0738) Resp:  [17-28] 17 (05/26 0738) BP: (98-144)/(60-87) 104/71 (05/26 0738) SpO2:  [95 %-100 %] 99 % (05/26 0738)  Intake/Output from previous day: 05/25 0701 - 05/26 0700 In: 2810 [I.V.:2200; IV Piggyback:610] Out: 4105 [Urine:3075; Drains:430; Blood:600] Intake/Output this shift: No intake/output data recorded.  NAD Breathing comfortably Dressing c/d Hemovac with dark red drainage 5/5 strength KE, DF, PF bilaterally  Lab Results: Recent Labs    10/09/20 1112 10/11/20 0612 10/12/20 0212  WBC 7.9  --  11.0*  HGB 17.1* 15.6 12.9*  HCT 50.9 46.0 39.4  PLT 226  --  177   BMET Recent Labs    10/09/20 1112 10/11/20 0612 10/12/20 0212  NA 136 138 134*  K 5.8* 4.2 4.6  CL 100 103 103  CO2 27  --  23  GLUCOSE 202* 102* 217*  BUN 23* 22* 20  CREATININE 1.10 1.00 1.06  CALCIUM 10.0  --  8.1*    Studies/Results: DG Lumbar Spine 2-3 Views  Result Date: 10/11/2020 CLINICAL DATA:  L3-L5 PLIF EXAM: DG C-ARM 1-60 MIN; LUMBAR SPINE - 2-3 VIEW CONTRAST:  none. FLUOROSCOPY TIME:  Fluoroscopy Time:  0 minutes 29 seconds Radiation Exposure Index (if provided by the fluoroscopic device): 28.4 mGy Number of Acquired Spot Images: 0 COMPARISON:  Lumbar spine MRI 04/11/2020 FINDINGS: Two intraoperative radiographs submitted for review. The images demonstrate in progress L3-L4 and L4-L5 posterior lumbar interbody fusion with bilateral pedicle screws and interbody grafts. IMPRESSION: In progress L3-L5 posterior lumbar interbody fusion. Electronically Signed   By: Jacqulynn Cadet M.D.   On: 10/11/2020 16:15   DG C-Arm 1-60 Min  Result Date: 10/11/2020 CLINICAL DATA:  L3-L5 PLIF EXAM: DG C-ARM 1-60 MIN;  LUMBAR SPINE - 2-3 VIEW CONTRAST:  none. FLUOROSCOPY TIME:  Fluoroscopy Time:  0 minutes 29 seconds Radiation Exposure Index (if provided by the fluoroscopic device): 28.4 mGy Number of Acquired Spot Images: 0 COMPARISON:  Lumbar spine MRI 04/11/2020 FINDINGS: Two intraoperative radiographs submitted for review. The images demonstrate in progress L3-L4 and L4-L5 posterior lumbar interbody fusion with bilateral pedicle screws and interbody grafts. IMPRESSION: In progress L3-L5 posterior lumbar interbody fusion. Electronically Signed   By: Jacqulynn Cadet M.D.   On: 10/11/2020 16:15    Assessment/Plan: POD#1 s/p PLIFs L3-4, L4-5 with small durotomy repaired primarily - will mobilize OOB with brace, PT/OT today - likely d/c drain tomorrow - lovenox for DVT ppx   Vallarie Mare 10/12/2020, 8:13 AM

## 2020-10-13 LAB — GLUCOSE, CAPILLARY
Glucose-Capillary: 195 mg/dL — ABNORMAL HIGH (ref 70–99)
Glucose-Capillary: 177 mg/dL — ABNORMAL HIGH (ref 70–99)
Glucose-Capillary: 204 mg/dL — ABNORMAL HIGH (ref 70–99)
Glucose-Capillary: 104 mg/dL — ABNORMAL HIGH (ref 70–99)
Glucose-Capillary: 154 mg/dL — ABNORMAL HIGH (ref 70–99)
Glucose-Capillary: 137 mg/dL — ABNORMAL HIGH (ref 70–99)

## 2020-10-13 MED ORDER — ASPIRIN EC 81 MG PO TBEC
81.0000 mg | DELAYED_RELEASE_TABLET | Freq: Every day | ORAL | Status: DC
  Administered 2020-10-14: 81 mg via ORAL
  Filled 2020-10-13: qty 1

## 2020-10-13 MED ORDER — SODIUM CHLORIDE 0.9 % IV BOLUS
500.0000 mL | Freq: Once | INTRAVENOUS | Status: AC
  Administered 2020-10-13: 500 mL via INTRAVENOUS

## 2020-10-13 NOTE — Progress Notes (Addendum)
   10/13/20 0005  Assess: MEWS Score  Temp 98.8 F (37.1 C)  BP (!) 78/52  Pulse Rate 78  Resp 19  Level of Consciousness Alert  SpO2 94 %  O2 Device Room Air  Assess: if the MEWS score is Yellow or Red  Were vital signs taken at a resting state? Yes  Focused Assessment No change from prior assessment  Early Detection of Sepsis Score *See Row Information* Low  MEWS guidelines implemented *See Row Information* Yes  Treat  MEWS Interventions Other (Comment)  Pain Scale 0-10  Pain Score 0  Take Vital Signs  Increase Vital Sign Frequency  Yellow: Q 2hr X 2 then Q 4hr X 2, if remains yellow, continue Q 4hrs  Escalate  MEWS: Escalate Yellow: discuss with charge nurse/RN and consider discussing with provider and RRT  Notify: Charge Nurse/RN  Name of Charge Nurse/RN Notified Joyce,RN  Date Charge Nurse/RN Notified 10/13/20  Time Charge Nurse/RN Notified 0036  Notify: Provider  Provider Name/Title Thomas,Jonathan,MD  Date Provider Notified 10/13/20  Time Provider Notified [phone]  Notification Type Page  Notification Reason Critical result  Document  Patient Outcome Other (Comment) (Pt is stable.Pt denies any changes from normal.)  Progress note created (see row info) Yes  Pt denies any symptoms. Pt stated " I'm perfectly fine".RN will continue to monitor patient. 6812 Orders given by MD.

## 2020-10-13 NOTE — Progress Notes (Signed)
Physical Therapy Treatment Patient Details Name: RAMBO SARAFIAN MRN: [id number] DOB: 10/02/1960 Today's Date: 10/13/2020    History of Present Illness Patient is a 60 y.o. male presents with severe back pain and bilateral leg pain, worse on the right. His back pain and leg pain are worse with activity. 5/25  Transforaminal Lumbar Interbody Fusion Lumbar 3-4, Lumbar 4-5.    PT Comments    Pt is progressing well towards goals. He increased ambulation distance this session with no dizziness or overt LOB. Pt reports he has been ambulating short distances in room with IV pole, but agrees ambulating with RW provides pain relief. Need to plan for stairs next session, pt too fatigued to complete this session.      Follow Up Recommendations  Home health PT;Supervision for mobility/OOB     Equipment Recommendations  Rolling walker with 5" wheels    Recommendations for Other Services       Precautions / Restrictions Precautions Precautions: Fall;Back Precaution Booklet Issued: Yes (comment) Precaution Comments: verbally reviewed all back precautions Required Braces or Orthoses: Spinal Brace Spinal Brace: Lumbar corset;Applied in sitting position;Other (comment) Spinal Brace Comments: brace donned on arrival, but loose. Educated on proper tightening    Mobility  Bed Mobility Overal bed mobility: Needs Assistance             General bed mobility comments: sitting EOB on arrival    Transfers Overall transfer level: Needs assistance Equipment used: Rolling walker (2 wheeled) Transfers: Sit to/from Stand Sit to Stand: Min guard         General transfer comment: incrased time, vc for safe hand positioning  Ambulation/Gait Ambulation/Gait assistance: Min guard Gait Distance (Feet): 300 Feet Assistive device: Rolling walker (2 wheeled) Gait Pattern/deviations: Step-through pattern;Decreased step length - right;Decreased step length - left Gait velocity: decr   General Gait  Details: Pt ambulated with RW, more for pain relief than balance. overall steady with RW, no overt LOB noted.   Stairs             Wheelchair Mobility    Modified Rankin (Stroke Patients Only)       Balance Overall balance assessment: Needs assistance Sitting-balance support: Feet supported Sitting balance-Leahy Scale: Good     Standing balance support: Bilateral upper extremity supported;During functional activity Standing balance-Leahy Scale: Fair Standing balance comment: benefits from B UE support at this time but has been up with just IV pole for support.                            Cognition Arousal/Alertness: Awake/alert Behavior During Therapy: WFL for tasks assessed/performed Overall Cognitive Status: Within Functional Limits for tasks assessed                                        Exercises      General Comments General comments (skin integrity, edema, etc.): Brother present for session      Pertinent Vitals/Pain Pain Assessment: Faces Faces Pain Scale: Hurts little more Pain Location: back, hips and legs Pain Descriptors / Indicators: Discomfort;Sore;Grimacing;Guarding Pain Intervention(s): Monitored during session;Limited activity within patient's tolerance    Home Living                      Prior Function            PT Goals (  current goals can now be found in the care plan section) Acute Rehab PT Goals Patient Stated Goal: to return home, decrease pain PT Goal Formulation: With patient Time For Goal Achievement: 10/17/20 Potential to Achieve Goals: Good    Frequency    Min 5X/week      PT Plan Current plan remains appropriate    Co-evaluation              AM-PAC PT "6 Clicks" Mobility   Outcome Measure  Help needed turning from your back to your side while in a flat bed without using bedrails?: A Little Help needed moving from lying on your back to sitting on the side of a flat bed  without using bedrails?: A Lot Help needed moving to and from a bed to a chair (including a wheelchair)?: A Little Help needed standing up from a chair using your arms (e.g., wheelchair or bedside chair)?: A Little Help needed to walk in hospital room?: A Little Help needed climbing 3-5 steps with a railing? : A Lot 6 Click Score: 16    End of Session Equipment Utilized During Treatment: Gait belt;Back brace Activity Tolerance: Patient tolerated treatment well Patient left: in bed;with call bell/phone within reach;with family/visitor present (sitting EOB) Nurse Communication: Mobility status;Patient requests pain meds PT Visit Diagnosis: Muscle weakness (generalized) (M62.81);Difficulty in walking, not elsewhere classified (R26.2);Other abnormalities of gait and mobility (R26.89);Pain Pain - part of body:  (back)     Time: 1610-9604 PT Time Calculation (min) (ACUTE ONLY): 13 min  Charges:  $Gait Training: 8-22 mins                     Benjiman Core, [place] Pager [phone] Acute Rehab   Allena Katz 10/13/2020, 11:50 AM

## 2020-10-13 NOTE — TOC Initial Note (Addendum)
Transition of Care [facility]) - Initial/Assessment Note    Patient Details  Name: Jorge Patrick MRN: [id number] Date of Birth: May 20, 1961  Transition of Care [facility]) CM/SW Contact:    Marilu Favre, RN Phone Number: 10/13/2020, 1:25 PM  Clinical Narrative:                  Confirmed face sheet information with patient.   Patient in agreement with home health PT.   Referral called to [facility] with [place], awaiting determination.[facility] has accepted referral Ramond Marrow aware   Levada Dy with [facility] is not in network with [place], and has no PT available for patient's address.   Stacie with [facility] reviewing referral, awaiting determination.[facility] has accepted referral Stacie aware   Malachy Mood with [facility] is not in network with patient's insurance.   Left Amy with [facility] a voicemail awaiting call back . [facility] has accepted referral Amy aware   [facility] , spoke to [place] , [facility] has accepted referral. Patient aware   Ordered tub bench and walker with Freda Munro with [facility]   Expected Discharge Plan: [place]     Patient Goals and CMS Choice Patient states their goals for this hospitalization and ongoing recovery are:: to return to home CMS Medicare.gov Compare Post Acute Care list provided to:: Patient Choice offered to / list presented to : Patient  Expected Discharge Plan and Services Expected Discharge Plan: [place] Choice: [place] arrangements for the past 2 months: Single Family Home                 DME Arranged: Walker rolling DME Agency: [employer] Date DME Agency Contacted: 10/13/20 Time DME Agency Contacted: 43 Representative spoke with at DME Agency: [place]: PT          Prior Living Arrangements/Services Living arrangements for the past 2 months: [place] Lives with:: Spouse Patient language and need for interpreter  reviewed:: Yes        Need for Family Participation in Patient Care: Yes (Comment) Care giver support system in place?: Yes (comment)   Criminal Activity/Legal Involvement Pertinent to Current Situation/Hospitalization: No - Comment as needed  Activities of Daily Living Home Assistive Devices/Equipment: None ADL Screening (condition at time of admission) Patient's cognitive ability adequate to safely complete daily activities?: Yes Is the patient deaf or have difficulty hearing?: Yes Does the patient have difficulty seeing, even when wearing glasses/contacts?: Yes Does the patient have difficulty concentrating, remembering, or making decisions?: Yes Patient able to express need for assistance with ADLs?: Yes Does the patient have difficulty dressing or bathing?: No Independently performs ADLs?: Yes (appropriate for developmental age) Does the patient have difficulty walking or climbing stairs?: Yes Weakness of Legs: Both Weakness of Arms/Hands: None  Permission Sought/Granted                  Emotional Assessment Appearance:: Appears stated age Attitude/Demeanor/Rapport: Engaged Affect (typically observed): Accepting Orientation: : Oriented to Self,Oriented to Place,Oriented to  Time,Oriented to Situation Alcohol / Substance Use: Not Applicable Psych Involvement: No (comment)  Admission diagnosis:  Lumbar radiculopathy [M54.16] Patient Active Problem List   Diagnosis Date Noted  . Lumbar radiculopathy 09/27/2020   PCP:  Jillyn Hidden, PA-C Pharmacy:   [id number] [place], [place] [address] [place] [address] Phone: [phone] Fax: [phone]     Social  Determinants of Health (SDOH) Interventions    Readmission Risk Interventions No flowsheet data found.

## 2020-10-13 NOTE — Progress Notes (Signed)
Subjective: Patient reports pain has significantly improved today, walked without assistive device.  Had hypoglycemic episode yesterday  Objective: Vital signs in last 24 hours: Temp:  [98.3 F (36.8 C)-98.8 F (37.1 C)] 98.3 F (36.8 C) (05/27 0900) Pulse Rate:  [78-90] 90 (05/27 0900) Resp:  [18-19] 18 (05/27 0208) BP: (78-101)/(51-63) 101/56 (05/27 0900) SpO2:  [92 %-97 %] 92 % (05/27 0900)  Intake/Output from previous day: 05/26 0701 - 05/27 0700 In: 1162.3 [I.V.:310.9; IV Piggyback:851.5] Out: 675 [Urine:350; Drains:325] Intake/Output this shift: Total I/O In: -  Out: 850 [Urine:850] NAD Breathing comfortably Hemovac removed, output was sanguinous Dressing c/d.  No drainage or leakage 5/5 strength HF, KE, DF, PF bilaterally  Lab Results: Recent Labs    10/11/20 0612 10/12/20 0212  WBC  --  11.0*  HGB 15.6 12.9*  HCT 46.0 39.4  PLT  --  177   BMET Recent Labs    10/11/20 0612 10/12/20 0212  NA 138 134*  K 4.2 4.6  CL 103 103  CO2  --  23  GLUCOSE 102* 217*  BUN 22* 20  CREATININE 1.00 1.06  CALCIUM  --  8.1*    Studies/Results: DG Lumbar Spine 2-3 Views  Result Date: 10/11/2020 CLINICAL DATA:  L3-L5 PLIF EXAM: DG C-ARM 1-60 MIN; LUMBAR SPINE - 2-3 VIEW CONTRAST:  none. FLUOROSCOPY TIME:  Fluoroscopy Time:  0 minutes 29 seconds Radiation Exposure Index (if provided by the fluoroscopic device): 28.4 mGy Number of Acquired Spot Images: 0 COMPARISON:  Lumbar spine MRI 04/11/2020 FINDINGS: Two intraoperative radiographs submitted for review. The images demonstrate in progress L3-L4 and L4-L5 posterior lumbar interbody fusion with bilateral pedicle screws and interbody grafts. IMPRESSION: In progress L3-L5 posterior lumbar interbody fusion. Electronically Signed   By: Jacqulynn Cadet M.D.   On: 10/11/2020 16:15   DG C-Arm 1-60 Min  Result Date: 10/11/2020 CLINICAL DATA:  L3-L5 PLIF EXAM: DG C-ARM 1-60 MIN; LUMBAR SPINE - 2-3 VIEW CONTRAST:  none.  FLUOROSCOPY TIME:  Fluoroscopy Time:  0 minutes 29 seconds Radiation Exposure Index (if provided by the fluoroscopic device): 28.4 mGy Number of Acquired Spot Images: 0 COMPARISON:  Lumbar spine MRI 04/11/2020 FINDINGS: Two intraoperative radiographs submitted for review. The images demonstrate in progress L3-L4 and L4-L5 posterior lumbar interbody fusion with bilateral pedicle screws and interbody grafts. IMPRESSION: In progress L3-L5 posterior lumbar interbody fusion. Electronically Signed   By: Jacqulynn Cadet M.D.   On: 10/11/2020 16:15    Assessment/Plan: POD#2 s/p L3-4, L4-5 TLIFs - continue to mobilize - diabetes consult given labile blood sugars - likely d/c home tomorrow with home PT  LOS: 2 days     Jorge Patrick 10/13/2020, 10:34 AM

## 2020-10-13 NOTE — Progress Notes (Signed)
Occupational Therapy Treatment Patient Details Name: Jorge Patrick MRN: [id number] DOB: 10-Jan-1961 Today's Date: 10/13/2020    History of present illness Patient is a 60 y.o. male presents with severe back pain and bilateral leg pain, worse on the right. His back pain and leg pain are worse with activity. 5/25  Transforaminal Lumbar Interbody Fusion Lumbar 3-4, Lumbar 4-5.   OT comments  Jorge Patrick is progressing well with no pain and pleasant for therapy, brother in room for session. Jorge Patrick demonstrated good use of AE for lower body dressing while maintaining back precautions. He was able to recall 2/4 precautions, and was educated on all precautions to prepare for safe discharge home. Pt donned independently donned back brace. Jorge Patrick continues to be min guard for functional mobility. Pt benefits from OT acutely should hid length of stay continue. D/c recommendation remains appropriate.    Follow Up Recommendations  No OT follow up;Supervision - Intermittent    Equipment Recommendations  Other (comment) (pt now reports that he has a tub shwoer bench)       Precautions / Restrictions Precautions Precautions: Fall;Back Precaution Booklet Issued: Yes (comment) Precaution Comments: verbally reviewed all back precautions Required Braces or Orthoses: Spinal Brace Spinal Brace: Lumbar corset;Applied in sitting position;Other (comment) Spinal Brace Comments: brace donned on arrival, but loose. Educated on proper tightening Restrictions Weight Bearing Restrictions: No       Mobility Bed Mobility Overal bed mobility: Needs Assistance Bed Mobility: Rolling;Sidelying to Sit Rolling: Min guard Sidelying to sit: Min guard       General bed mobility comments: min guard, demonstrated good log rolling    Transfers Overall transfer level: Needs assistance Equipment used: Rolling walker (2 wheeled) Transfers: Sit to/from Stand Sit to Stand: Min guard         General transfer comment: incrased  time, vc for safe hand positioning    Balance Overall balance assessment: Needs assistance Sitting-balance support: Feet supported Sitting balance-Leahy Scale: Good     Standing balance support: Bilateral upper extremity supported;During functional activity Standing balance-Leahy Scale: Fair Standing balance comment: benefits from B UE support at this time but has been up with just IV pole for support.      ADL either performed or assessed with clinical judgement   ADL Overall ADL's : Needs assistance/impaired Eating/Feeding: Independent;Sitting           Lower Body Bathing:  (verbally reviewed lower body bathing with long sponge and in sitting for safety)       Lower Body Dressing: Minimal assistance Lower Body Dressing Details (indicate cue type and reason): pt educated on AE for lower body dressing, demonstrated good use of sock aide and reacher             Functional mobility during ADLs: Min guard;Rolling walker General ADL Comments: A levels for lower body dressing, pt states his brother will help him wtih ADLs for the time being               Cognition Arousal/Alertness: Awake/alert Behavior During Therapy: WFL for tasks assessed/performed Overall Cognitive Status: Within Functional Limits for tasks assessed      General Comments: pt recalled 2/4 back precautions              General Comments brother present for session    Pertinent Vitals/ Pain       Pain Assessment: Faces Faces Pain Scale: Hurts a little bit Pain Location: back, hips and legs Pain Descriptors / Indicators: Discomfort;Sore;Grimacing;Guarding Pain Intervention(s):  Monitored during session         Frequency  Min 2X/week        Progress Toward Goals  OT Goals(current goals can now be found in the care plan section)  Progress towards OT goals: Progressing toward goals  Acute Rehab OT Goals Patient Stated Goal: to return home, decrease pain OT Goal Formulation: With  patient Time For Goal Achievement: 10/26/20 Potential to Achieve Goals: Good ADL Goals Pt Will Perform Lower Body Bathing: with supervision;with adaptive equipment;sitting/lateral leans Pt Will Perform Lower Body Dressing: with supervision;with adaptive equipment;sit to/from stand Pt Will Transfer to Toilet: with modified independence;ambulating;regular height toilet Additional ADL Goal #1: Pt will indep recall 4/4 back precautions and demonstrated proper log rolling to prepare to safe transition into the home environment.  Plan Discharge plan remains appropriate       AM-PAC OT "6 Clicks" Daily Activity     Outcome Measure   Help from another person eating meals?: None Help from another person taking care of personal grooming?: A Little Help from another person toileting, which includes using toliet, bedpan, or urinal?: A Little Help from another person bathing (including washing, rinsing, drying)?: A Little Help from another person to put on and taking off regular upper body clothing?: A Little Help from another person to put on and taking off regular lower body clothing?: A Little 6 Click Score: 19    End of Session Equipment Utilized During Treatment: Gait belt;Rolling walker  OT Visit Diagnosis: Unsteadiness on feet (R26.81);Muscle weakness (generalized) (M62.81);Pain Pain - Right/Left:  (back) Pain - part of body: Leg   Activity Tolerance Patient tolerated treatment well   Patient Left in bed;with call bell/phone within reach;with bed alarm set   Nurse Communication Mobility status;Other (comment)        Time: 6803-2122 OT Time Calculation (min): 22 min  Charges: OT General Charges $OT Visit: 1 Visit OT Treatments $Self Care/Home Management : 8-22 mins     Kahliya Fraleigh A Delanda Patrick 10/13/2020, 2:19 PM

## 2020-10-13 NOTE — Progress Notes (Signed)
Inpatient Diabetes Program Recommendations  AACE/ADA: New Consensus Statement on Inpatient Glycemic Control (2015)  Target Ranges:  Prepandial:   less than 140 mg/dL      Peak postprandial:   less than 180 mg/dL (1-2 hours)      Critically ill patients:  140 - 180 mg/dL   Lab Results  Component Value Date   GLUCAP 204 (H) 10/13/2020   Results for Jorge Patrick, Jorge Patrick (MRN [id number]) as of 10/13/2020 12:47  Ref. Range 10/12/2020 16:54 10/12/2020 21:37 10/13/2020 02:14 10/13/2020 06:46 10/13/2020 11:57  Glucose-Capillary Latest Ref Range: 70 - 99 mg/dL 201 (H) 196 (H) 195 (H) 177 (H) 204 (H)   Review of Glycemic Control  Diabetes history: type 2 Outpatient Diabetes medications: Lantus 72 units at HS, Novolog 30 units TID, Jardiance 25 mg daily, Glucotrol 10 mg BID, Metformin 1000 mg BID Current orders for Inpatient glycemic control: Lantus 72 units at HS, Novolog 30 units TID, Jardiance 25 mg daily, Glucotrol 10 mg BID, Metformin 1000 mg BID  Inpatient Diabetes Program Recommendations:   Received diabetes coordinator consult for insulin management. Note that CBGs are in fairly good control with being on his home medication regimen. Will continue to monitor blood sugars while in the hospital.  Harvel Ricks RN BSN CDE Diabetes Coordinator Pager: [phone]  8am-5pm

## 2020-10-14 LAB — GLUCOSE, CAPILLARY: Glucose-Capillary: 126 mg/dL — ABNORMAL HIGH (ref 70–99)

## 2020-10-14 MED ORDER — CYCLOBENZAPRINE HCL 10 MG PO TABS: 10.0000 mg | ORAL_TABLET | Freq: Three times a day (TID) | ORAL | 0 refills | Status: AC | PRN

## 2020-10-14 MED ORDER — OXYCODONE-ACETAMINOPHEN 5-325 MG PO TABS: 1.0000 | ORAL_TABLET | Freq: Four times a day (QID) | ORAL | 0 refills | Status: AC | PRN

## 2020-10-14 NOTE — Progress Notes (Signed)
Subjective: Patient reports that he is doing well. His only complaint is of mild incisional discomfort that is well controlled on PO analgesics. No acute events overnight.   Objective: Vital signs in last 24 hours: Temp:  [98.4 F (36.9 C)] 98.4 F (36.9 C) (05/27 2100) Pulse Rate:  [87-89] 87 (05/27 2100) Resp:  [18] 18 (05/27 2100) BP: (104-106)/(57-61) 106/61 (05/27 2100) SpO2:  [95 %-96 %] 96 % (05/27 2100)  Intake/Output from previous day: 05/27 0701 - 05/28 0700 In: 600 [P.O.:600] Out: 900 [Urine:850; Drains:50] Intake/Output this shift: No intake/output data recorded.  Physical Exam: Patient is awake, A/O X 4, conversant, and in good spirits. Speech is fluent and appropriate. Doing well. MAEW with good strength that is symmetric bilaterally. Dressing is clean dry intact.  Lab Results: Recent Labs    10/12/20 0212  WBC 11.0*  HGB 12.9*  HCT 39.4  PLT 177   BMET Recent Labs    10/12/20 0212  NA 134*  K 4.6  CL 103  CO2 23  GLUCOSE 217*  BUN 20  CREATININE 1.06  CALCIUM 8.1*    Studies/Results: DG C-Arm 1-60 Min-No Report  Result Date: 10/13/2020 Fluoroscopy was utilized by the requesting physician.  No radiographic interpretation.    Assessment/Plan: Patient is post-op day 3 s/pL3-4, L4-5 TLIF. He is recovering well and reports a resolution of his preoperative symptoms.  His only complaint is mild incisional discomfort. PT recommending home health PT. OT recommending no OT follow up needed. Continue LSO brace when OOB. Continue working on pain control, mobility and ambulating patient. Will plan for discharge today.    LOS: 3 days     Jorge Moeller, DNP, NP-C 10/14/2020, 9:53 AM

## 2020-10-14 NOTE — Discharge Summary (Signed)
Physician Discharge Summary  Patient ID: Jorge Patrick MRN: [id number] DOB/AGE: 07/19/1960 60 y.o.  Admit date: 10/11/2020 Discharge date: 10/14/2020  Admission Diagnoses: Lumbar radiculopathy and severe stenosis  Discharge Diagnoses: Lumbar radiculopathy and severe stenosis Active Problems:   Lumbar radiculopathy   Discharged Condition: good  Hospital Course: The patient was admitted on 10/11/2020 and taken to the operating room where the patient underwent decompression and fusion. The patient tolerated the procedure well and was taken to the recovery room and then to the floor in stable condition. The hospital course was routine. There were no complications. The wound remained clean dry and intact. Pt had appropriate back soreness. No complaints of leg pain or new N/T/W. The patient remained afebrile with stable vital signs, and tolerated a regular diet. The patient continued to increase activities, and pain was well controlled with oral pain medications.   Consults: None  Significant Diagnostic Studies: radiology: X-ray  Treatments: surgery:  1. Left Transforaminal lumbar interbody arthrodesis L3-4 2. Right transforaminal lumbar interbody arthrodesis L4-5 3. Posterior/posterolateral arthrodesis L3-4 4. Posterior/posterolateral arthrodesis L4-5 5. L3-4, L4-5  laminectomy with facetectomy for decompression of exiting nerve roots, more than would be required for placement of interbody graft 6. Placement of Expandable interbody device - L3-4 7. Placement of Expandable interbody device - L4-5 8. Posterior segmental instrumentation using cortical pedicle screws at L3-4-5 9. Use of locally harvested bone autograft 10. Use of non-structural bone allograft  11. Use of spinal neuronavigation  Discharge Exam: Blood pressure 106/61, pulse 87, temperature 98.4 F (36.9 C), temperature source Oral, resp. rate 18, height 5\' 10"  (1.778 m), weight 120.2 kg, SpO2 96 %. Physical Exam: Patient is  awake, A/O X 4, conversant, and in good spirits. Speech is fluent and appropriate. Doing well. MAEW with good strength that is symmetric bilaterally. Dressing is clean dry intact.  Disposition: Discharge disposition: 01-Home or Self Care       Discharge Instructions    Ambulatory referral to [facility]   Complete by: As directed    Please evaluate Jorge Patrick for admission to [facility].  Disciplines requested: Physical Therapy  Services to provide: Evaluate  Physician to follow patient's care (the person listed here will be responsible for signing ongoing orders): Referring Provider  Requested Start of Care Date: Within 2-3 days  I certify that this patient is under my care and that I, or a Nurse Practitioner or Physician's Assistant working with me, had a face-to-face encounter that meets the physician face-to-face requirements with patient on 10/14/2020. The encounter with the patient was in whole, or in part for the following medical condition(s) which is the primary reason for home health care (List medical condition). Lumbar radiculopathy and severe stenosis  Special Instructions:   Does the patient have Medicare or Medicaid?: No   The encounter with the patient was in whole, or in part, for the following medical condition, which is the primary reason for home health care: Lumbar radiculopathy and severe stenosis   Reason for Medically Necessary Home Health Services: Therapy- Therapeutic Exercises to Increase Strength and Endurance   My clinical findings support the need for the above services: Pain interferes with ambulation/mobility   I certify that, based on my findings, the following services are medically necessary home health services: Physical therapy   Further, I certify that my clinical findings support that this patient is homebound due to: Ambulates short distances less than 300 feet   Incentive spirometry RT   Complete by: As  directed      Allergies as of  10/14/2020      Reactions   Ticagrelor Nausea Only, Swelling   Bleeding/    Lisinopril Cough      Medication List    TAKE these medications   Albuterol Sulfate 108 (90 Base) MCG/ACT Aepb Commonly known as: PROAIR RESPICLICK Inhale 2 puffs into the lungs every 8 (eight) hours as needed for shortness of breath.   Bayer Low Dose 81 MG chewable tablet Generic drug: aspirin Chew 81 mg by mouth daily.   Cholecalciferol 50 MCG (2000 UT) Caps Take 2,000 Units by mouth daily.   clopidogrel 75 MG tablet Commonly known as: PLAVIX Take 75 mg by mouth daily.   cyclobenzaprine 10 MG tablet Commonly known as: FLEXERIL Take 1 tablet (10 mg total) by mouth 3 (three) times daily as needed for muscle spasms.   Entresto 97-103 MG Generic drug: sacubitril-valsartan Take 1 tablet by mouth 2 (two) times daily.   FLUoxetine 40 MG capsule Commonly known as: PROZAC Take 40 mg by mouth daily.   furosemide 40 MG tablet Commonly known as: LASIX Take 40 mg by mouth 2 (two) times daily.   gabapentin 300 MG capsule Commonly known as: NEURONTIN Take 600 mg by mouth 3 (three) times daily.   glipiZIDE 10 MG tablet Commonly known as: GLUCOTROL Take 10 mg by mouth 2 (two) times daily.   isosorbide mononitrate 60 MG 24 hr tablet Commonly known as: IMDUR Take 60 mg by mouth daily.   Jardiance 25 MG Tabs tablet Generic drug: empagliflozin Take 25 mg by mouth daily.   Lantus SoloStar 100 UNIT/ML Solostar Pen Generic drug: insulin glargine Inject 72 Units into the skin at bedtime.   metFORMIN 1000 MG tablet Commonly known as: GLUCOPHAGE Take 1,000 mg by mouth 2 (two) times daily.   metoprolol 200 MG 24 hr tablet Commonly known as: TOPROL-XL Take 200 mg by mouth daily.   nitroGLYCERIN 0.4 MG SL tablet Commonly known as: NITROSTAT Place 0.4 mg under the tongue every 2 (two) hours as needed.   NovoLOG FlexPen 100 UNIT/ML FlexPen Generic drug: insulin aspart Inject 30 Units into the skin  3 (three) times daily before meals.   OVER THE COUNTER MEDICATION Take 1 capsule by mouth daily. Move MD   oxyCODONE-acetaminophen 5-325 MG tablet Commonly known as: PERCOCET/ROXICET Take 1-2 tablets by mouth every 6 (six) hours as needed for moderate pain or severe pain.   Potassium 99 MG Tabs Take 99 mg by mouth daily.   pravastatin 80 MG tablet Commonly known as: PRAVACHOL Take 80 mg by mouth daily.   spironolactone 25 MG tablet Commonly known as: ALDACTONE Take 25 mg by mouth daily.            Durable Medical Equipment  (From admission, onward)         Start     Ordered   10/13/20 1345  For home use only DME Walker rolling  Once       Question Answer Comment  Walker: With 5 Inch Wheels   Patient needs a walker to treat with the following condition Weakness      10/13/20 1345   10/13/20 1345  For home use only DME Tub bench  Once        10/13/20 1345          Follow-up Information    [facility] Follow up.   Specialty: Home Health Services Contact information: [address]  [place] [address] [phone]               Signed: Marvis Moeller, DNP, NP-C 10/14/2020, 10:20 AM

## 2020-10-14 NOTE — Progress Notes (Signed)
Physical Therapy Treatment Patient Details Name: Jorge Patrick MRN: [id number] DOB: 09-09-1960 Today's Date: 10/14/2020    History of Present Illness Patient is a 60 y.o. male presents with severe back pain and bilateral leg pain, worse on the right. His back pain and leg pain are worse with activity. 5/25  Transforaminal Lumbar Interbody Fusion Lumbar 3-4, Lumbar 4-5.    PT Comments    Pt progressing well with mobility and he feels safe for DC today.  I have encouraged the patient to gradually increase activity daily to tolerance.  I have answered all patient's question regarding PT and mobility.      Follow Up Recommendations  Home health PT;Supervision for mobility/OOB     Equipment Recommendations  Rolling walker with 5" wheels    Recommendations for Other Services       Precautions / Restrictions Precautions Precautions: Fall;Back Precaution Booklet Issued: Yes (comment) Precaution Comments: pt able to teach back all 3 back precautions Required Braces or Orthoses: Spinal Brace Spinal Brace: Lumbar corset;Applied in sitting position;Other (comment) Spinal Brace Comments: educated pt on proper fit and donning of brace    Mobility  Bed Mobility Overal bed mobility:  (NT, pt up upon arrival and did not want to return to supine. Reports no difficulties)                  Transfers Overall transfer level: Needs assistance Equipment used: Rolling walker (2 wheeled) Transfers: Sit to/from Stand Sit to Stand: Supervision         General transfer comment: Cues for safest hand placement  Ambulation/Gait Ambulation/Gait assistance: Supervision Gait Distance (Feet): 150 Feet (1 standing rest break for about 30 seconds) Assistive device: Rolling walker (2 wheeled) Gait Pattern/deviations: Step-through pattern;Decreased stride length Gait velocity: decreased       Stairs Stairs: Yes Stairs assistance: Min guard Stair Management: Two rails;Step to  pattern;Forwards Number of Stairs: 5 General stair comments: no issues with stair climbing   Wheelchair Mobility    Modified Rankin (Stroke Patients Only)       Balance                                            Cognition Arousal/Alertness: Awake/alert Behavior During Therapy: WFL for tasks assessed/performed Overall Cognitive Status: Within Functional Limits for tasks assessed                                        Exercises      General Comments General comments (skin integrity, edema, etc.): No family present      Pertinent Vitals/Pain Pain Assessment: 0-10 Pain Score: 6  Pain Location: back Pain Descriptors / Indicators: Discomfort;Sore Pain Intervention(s): Limited activity within patient's tolerance;Premedicated before session;Monitored during session    Home Living                      Prior Function            PT Goals (current goals can now be found in the care plan section) Acute Rehab PT Goals Patient Stated Goal: to go home today Progress towards PT goals: Progressing toward goals    Frequency           PT Plan Current plan remains appropriate  Co-evaluation              AM-PAC PT "6 Clicks" Mobility   Outcome Measure  Help needed turning from your back to your side while in a flat bed without using bedrails?: A Little Help needed moving from lying on your back to sitting on the side of a flat bed without using bedrails?: A Little Help needed moving to and from a bed to a chair (including a wheelchair)?: A Little Help needed standing up from a chair using your arms (e.g., wheelchair or bedside chair)?: A Little Help needed to walk in hospital room?: A Little Help needed climbing 3-5 steps with a railing? : A Little 6 Click Score: 18    End of Session Equipment Utilized During Treatment: Gait belt;Back brace Activity Tolerance: Patient tolerated treatment well Patient left: in  bed;with call bell/phone within reach (Sitting EOB, pt had been walking out of BR without RW when PT first arrived)   PT Visit Diagnosis: Muscle weakness (generalized) (M62.81);Difficulty in walking, not elsewhere classified (R26.2);Other abnormalities of gait and mobility (R26.89)     Time: 6190-1222 PT Time Calculation (min) (ACUTE ONLY): 20 min  Charges:  $Gait Training: 8-22 mins                     Jorge Patrick, PT   Acute Rehabilitation Services  Pager [phone] Office [phone] 10/14/2020    Jorge Patrick 10/14/2020, 9:02 AM

## 2020-10-14 NOTE — TOC Transition Note (Signed)
Transition of Care [facility]) - CM/SW Discharge Note   Patient Details  Name: Jorge Patrick MRN: [id number] Date of Birth: [birth year]  Transition of Care [facility]) CM/SW Contact:  Bartholomew Crews, RN Phone Number: [phone] 10/14/2020, 11:39 AM   Clinical Narrative:     Patient to transition home today. Noted previous CM has arranged [facility] [facility] through [facility]. HH orders placed per MD. Attempted to notify [facility] - received unlabeled generic voicemail, no message left.   Final next level of care: [place] Barriers to Discharge: No Barriers Identified   Patient Goals and CMS Choice Patient states their goals for this hospitalization and ongoing recovery are:: to return to home CMS Medicare.gov Compare Post Acute Care list provided to:: Patient Choice offered to / list presented to : Patient  Discharge Placement                       Discharge Plan and Services     Post Acute Care Choice: Home Health          DME Arranged: Walker rolling DME Agency: [employer] Date DME Agency Contacted: 10/13/20 Time DME Agency Contacted: 61 Representative spoke with at DME Agency: [place]: PT   Date [place]: 10/14/20 Time [place]: 1138 Representative spoke with at [place]: no answer - generic voicemail, no message left  Social Determinants of Health (SDOH) Interventions     Readmission Risk Interventions No flowsheet data found.

## 2022-02-17 IMAGING — RF DG C-ARM 1-60 MIN
1 series · 2 of 2 positions shown · IV contrast (agent unspecified)
Comparison: Lumbar spine MRI 04/11/2020

CLINICAL DATA: L3-L5 PLIF

EXAM:
DG C-ARM 1-60 MIN; LUMBAR SPINE - 2-3 VIEW
CONTRAST:  none.
FLUOROSCOPY TIME:  Fluoroscopy Time:  0 minutes 29 seconds
Radiation Exposure Index (if provided by the fluoroscopic device):
28.4 mGy
Number of Acquired Spot Images: 0

[Series 1: unknown protocol · 0.14mm/px · 2 of 2 slices shown]
[im 1/2]
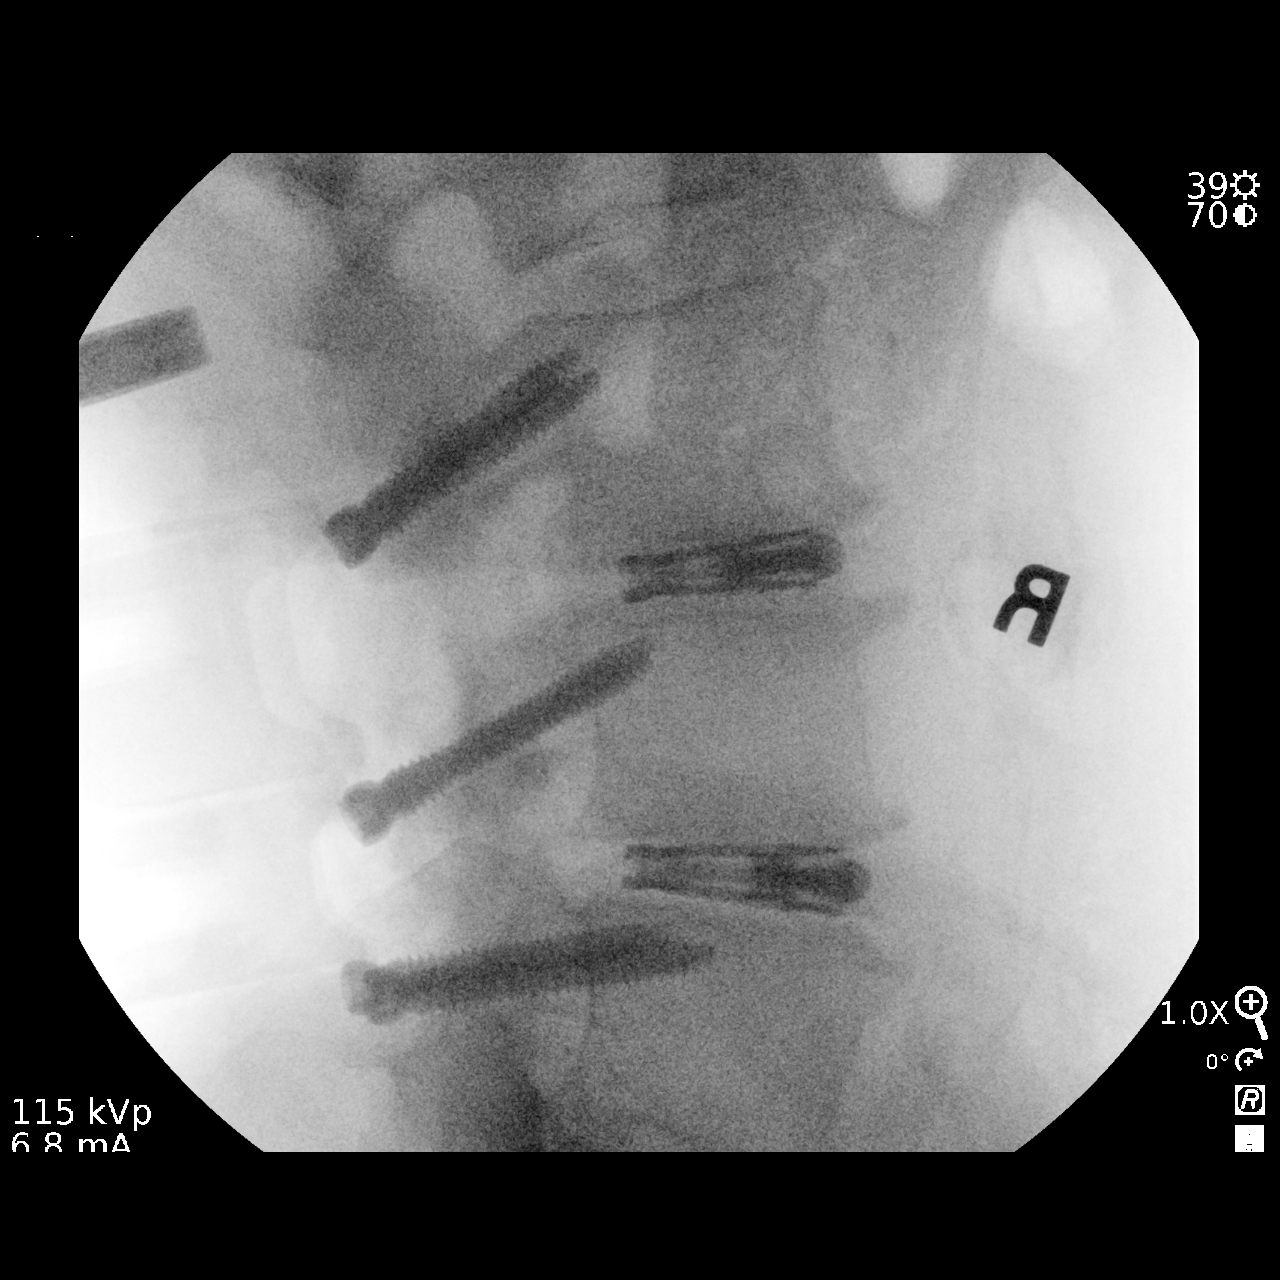
[im 2/2]
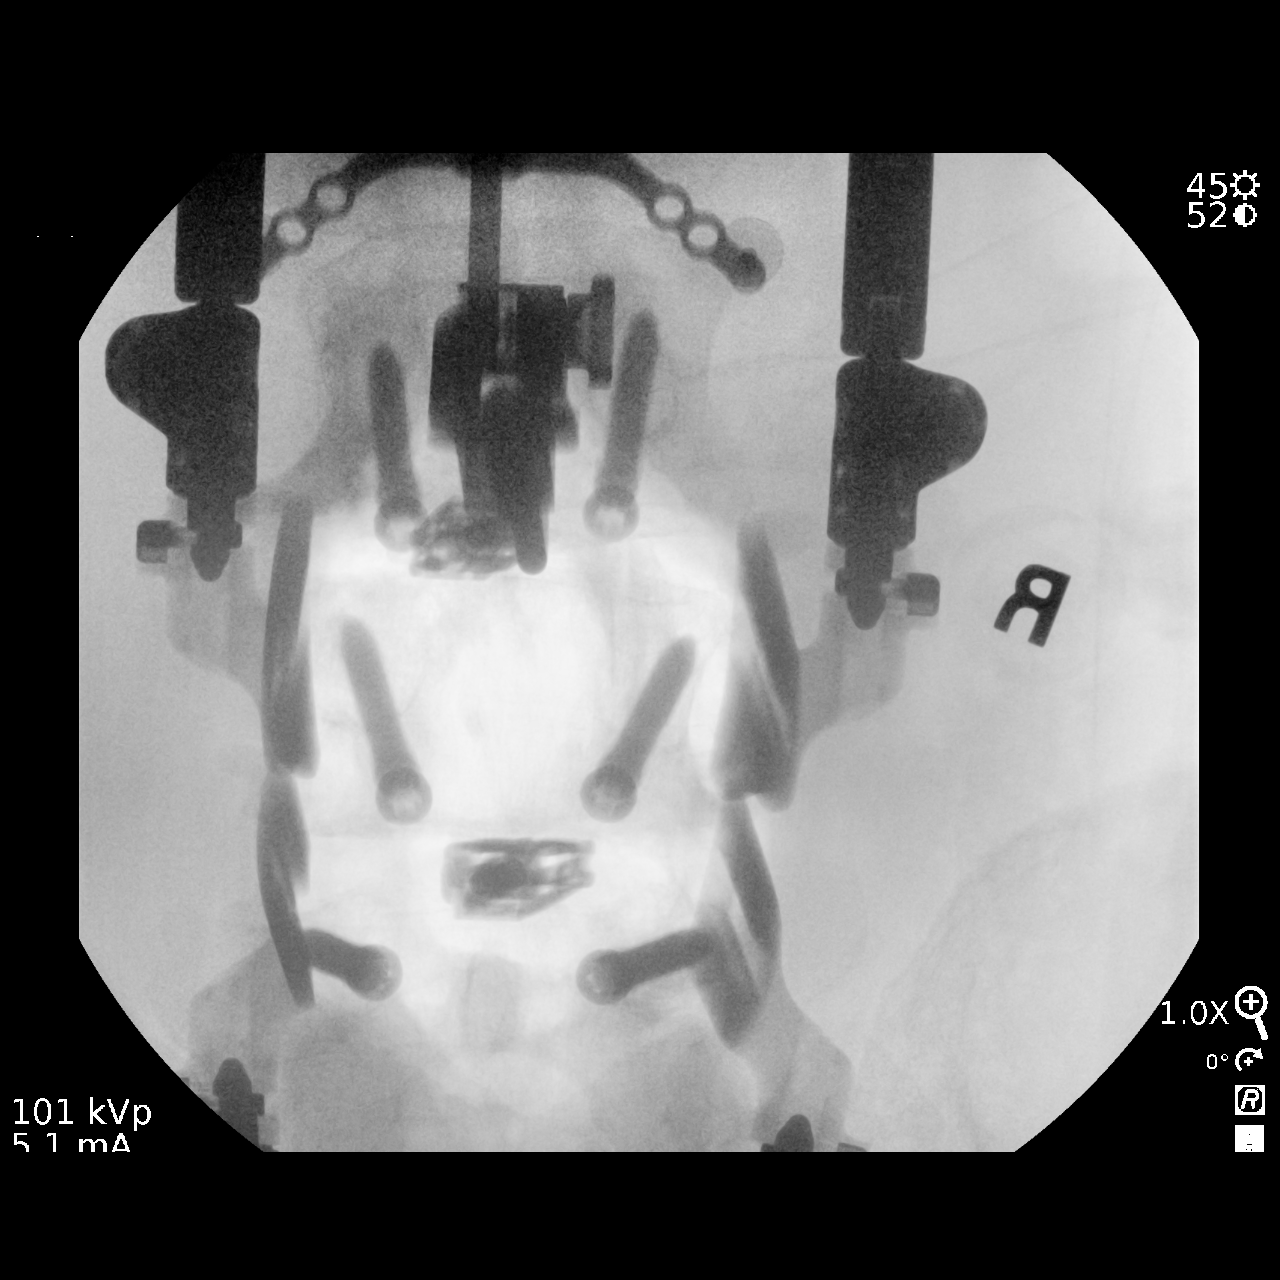

[2 of 2 positions shown; findings below may reference images not displayed]

FINDINGS: Two intraoperative radiographs submitted for review. The images
demonstrate in progress L3-L4 and L4-L5 posterior lumbar interbody
fusion with bilateral pedicle screws and interbody grafts.
IMPRESSION: In progress L3-L5 posterior lumbar interbody fusion.

## 2022-02-17 IMAGING — RF DG LUMBAR SPINE 2-3V
1 series · 2 of 2 positions shown · IV contrast (agent unspecified)
Comparison: Lumbar spine MRI 04/11/2020

CLINICAL DATA: L3-L5 PLIF

EXAM:
DG C-ARM 1-60 MIN; LUMBAR SPINE - 2-3 VIEW
CONTRAST:  none.
FLUOROSCOPY TIME:  Fluoroscopy Time:  0 minutes 29 seconds
Radiation Exposure Index (if provided by the fluoroscopic device):
28.4 mGy
Number of Acquired Spot Images: 0

[Series 1: unknown protocol · 0.14mm/px · 2 of 2 slices shown]
[im 1/2]
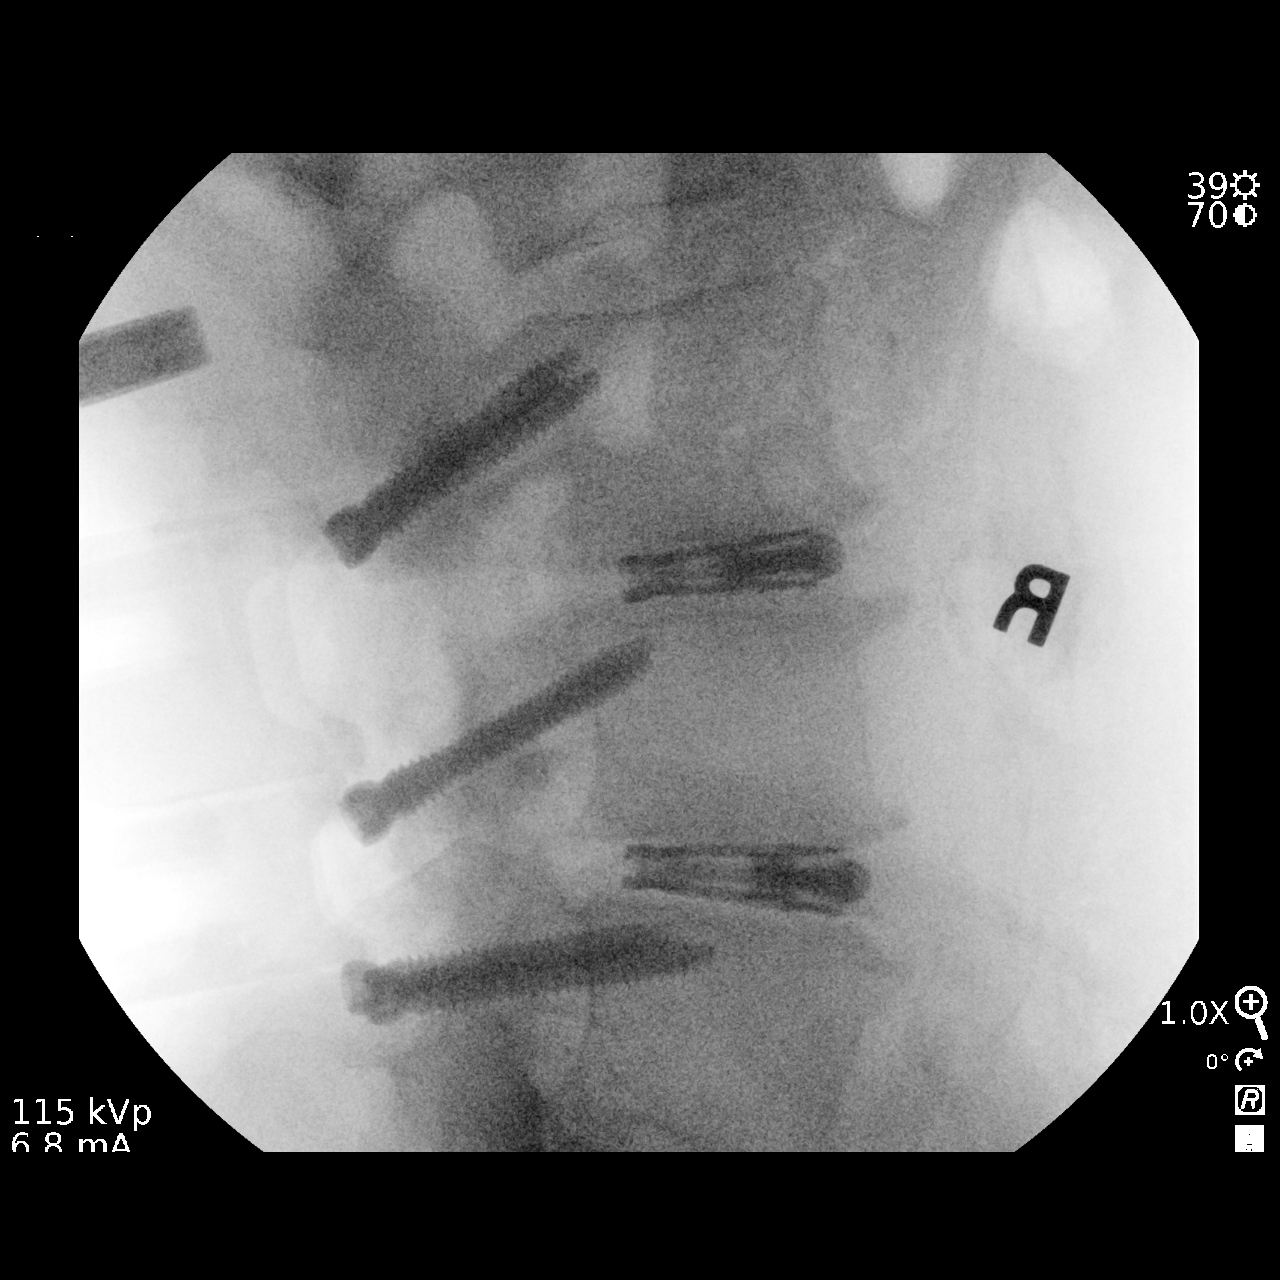
[im 2/2]
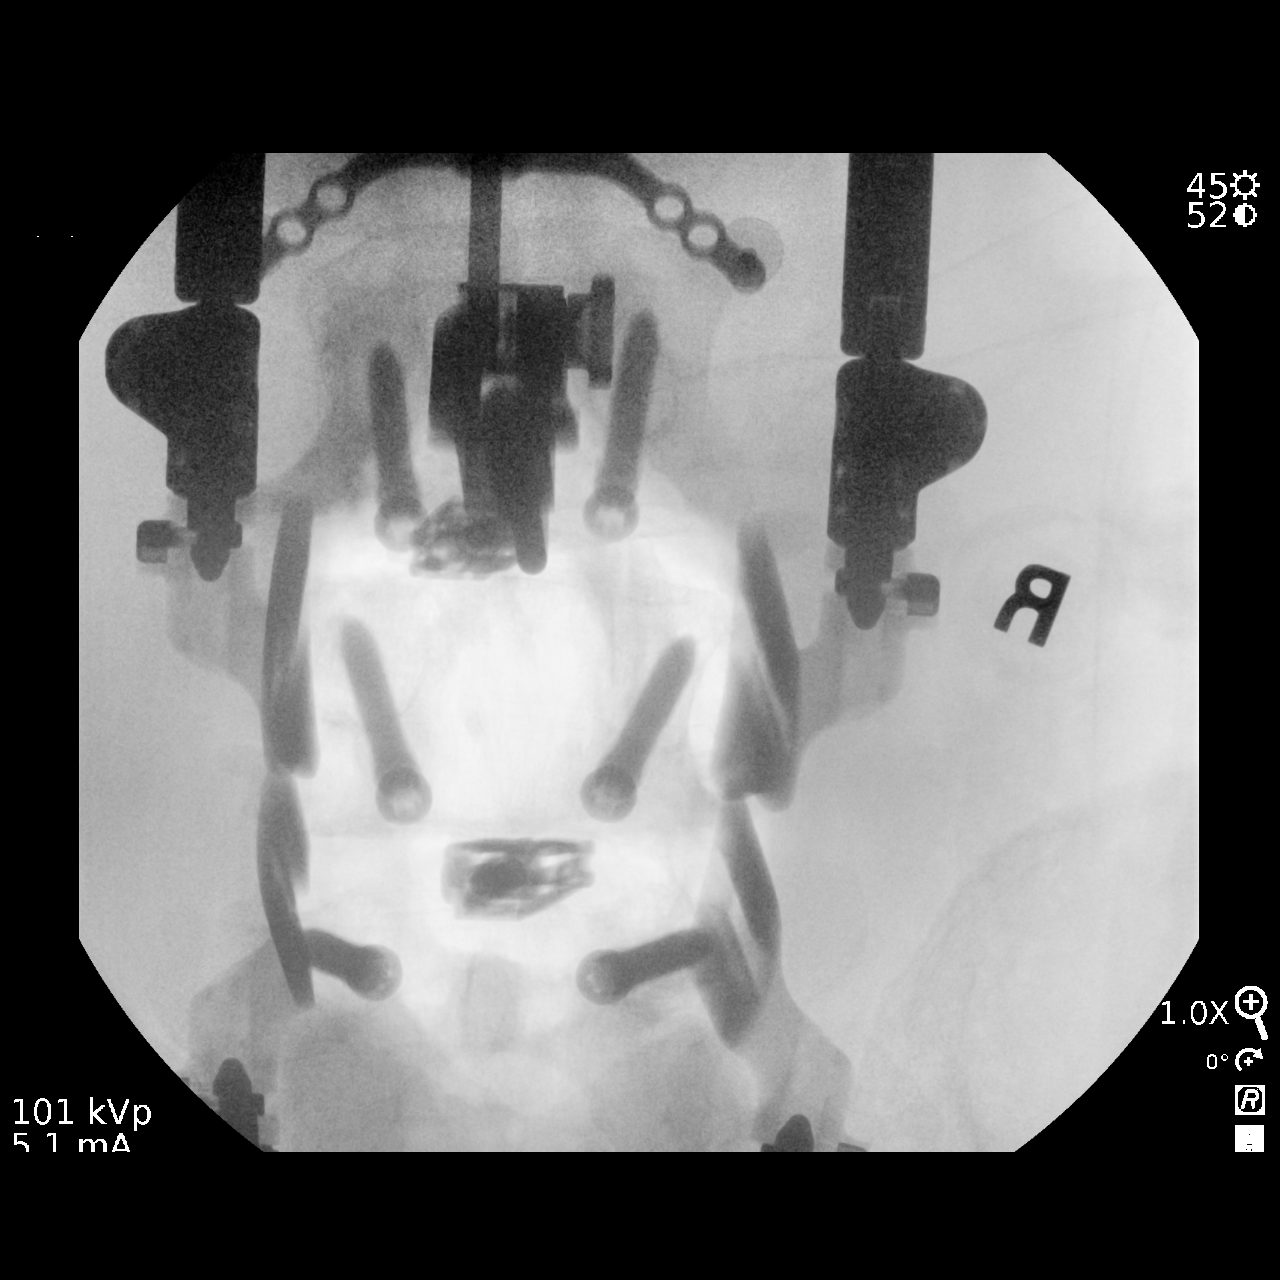

[2 of 2 positions shown; findings below may reference images not displayed]

FINDINGS: Two intraoperative radiographs submitted for review. The images
demonstrate in progress L3-L4 and L4-L5 posterior lumbar interbody
fusion with bilateral pedicle screws and interbody grafts.
IMPRESSION: In progress L3-L5 posterior lumbar interbody fusion.
